# Patient Record
Sex: Female | Born: 1985 | Race: White | Hispanic: No | Marital: Single | Smoking: Never smoker
Health system: Southern US, Community
[De-identification: ages and names within clinical notes are randomized; demographics above are authoritative.]

## PROBLEM LIST (undated history)

## (undated) DIAGNOSIS — Z3490 Encounter for supervision of normal pregnancy, unspecified, unspecified trimester: Secondary | ICD-10-CM

## (undated) DIAGNOSIS — Q992 Fragile X chromosome: Secondary | ICD-10-CM

## (undated) HISTORY — PX: BREAST SURGERY: SHX581

## (undated) HISTORY — DX: Fragile x chromosome: Q99.2

## (undated) HISTORY — PX: APPENDECTOMY: SHX54

## (undated) HISTORY — PX: ECTOPIC PREGNANCY SURGERY: SHX613

## (undated) HISTORY — PX: UNILATERAL SALPINGECTOMY: SHX6160

## (undated) HISTORY — PX: CLAVICLE SURGERY: SHX598

---

## 2010-07-28 DIAGNOSIS — I499 Cardiac arrhythmia, unspecified: Secondary | ICD-10-CM

## 2016-10-05 ENCOUNTER — Encounter (HOSPITAL_COMMUNITY): Payer: Self-pay | Admitting: Emergency Medicine

## 2016-10-05 ENCOUNTER — Emergency Department (HOSPITAL_COMMUNITY)
Admission: EM | Admit: 2016-10-05 | Discharge: 2016-10-05 | Disposition: A | Discharge status: Home / Self Care | Payer: 59 | Attending: Emergency Medicine | Admitting: Emergency Medicine

## 2016-10-05 ENCOUNTER — Emergency Department (HOSPITAL_COMMUNITY): Payer: 59

## 2016-10-05 DIAGNOSIS — R109 Unspecified abdominal pain: Secondary | ICD-10-CM

## 2016-10-05 DIAGNOSIS — R82998 Other abnormal findings in urine: Secondary | ICD-10-CM

## 2016-10-05 DIAGNOSIS — R8299 Other abnormal findings in urine: Secondary | ICD-10-CM | POA: Diagnosis not present

## 2016-10-05 DIAGNOSIS — N760 Acute vaginitis: Secondary | ICD-10-CM

## 2016-10-05 DIAGNOSIS — B9689 Other specified bacterial agents as the cause of diseases classified elsewhere: Secondary | ICD-10-CM | POA: Diagnosis not present

## 2016-10-05 DIAGNOSIS — O2341 Unspecified infection of urinary tract in pregnancy, first trimester: Secondary | ICD-10-CM | POA: Insufficient documentation

## 2016-10-05 DIAGNOSIS — R102 Pelvic and perineal pain: Secondary | ICD-10-CM | POA: Insufficient documentation

## 2016-10-05 DIAGNOSIS — D72829 Elevated white blood cell count, unspecified: Secondary | ICD-10-CM | POA: Diagnosis not present

## 2016-10-05 DIAGNOSIS — Z3A08 8 weeks gestation of pregnancy: Secondary | ICD-10-CM | POA: Insufficient documentation

## 2016-10-05 DIAGNOSIS — O23591 Infection of other part of genital tract in pregnancy, first trimester: Secondary | ICD-10-CM | POA: Insufficient documentation

## 2016-10-05 DIAGNOSIS — O26891 Other specified pregnancy related conditions, first trimester: Secondary | ICD-10-CM | POA: Diagnosis not present

## 2016-10-05 DIAGNOSIS — O26899 Other specified pregnancy related conditions, unspecified trimester: Secondary | ICD-10-CM

## 2016-10-05 DIAGNOSIS — O99111 Other diseases of the blood and blood-forming organs and certain disorders involving the immune mechanism complicating pregnancy, first trimester: Secondary | ICD-10-CM | POA: Diagnosis not present

## 2016-10-05 LAB — I-STAT CG4 LACTIC ACID, ED
Lactic Acid, Venous: 1.16 mmol/L (ref 0.5–1.9)
Lactic Acid, Venous: 0.54 mmol/L (ref 0.5–1.9)

## 2016-10-05 LAB — COMPREHENSIVE METABOLIC PANEL
ALT: 12 U/L — ABNORMAL LOW (ref 14–54)
ANION GAP: 9 (ref 5–15)
AST: 17 U/L (ref 15–41)
Albumin: 3.9 g/dL (ref 3.5–5.0)
Alkaline Phosphatase: 39 U/L (ref 38–126)
BUN: 6 mg/dL (ref 6–20)
CHLORIDE: 103 mmol/L (ref 101–111)
CO2: 22 mmol/L (ref 22–32)
CREATININE: 0.78 mg/dL (ref 0.44–1.00)
Calcium: 9 mg/dL (ref 8.9–10.3)
Glucose, Bld: 107 mg/dL — ABNORMAL HIGH (ref 65–99)
POTASSIUM: 3.4 mmol/L — AB (ref 3.5–5.1)
Sodium: 134 mmol/L — ABNORMAL LOW (ref 135–145)
Total Bilirubin: 1.2 mg/dL (ref 0.3–1.2)
Total Protein: 6.9 g/dL (ref 6.5–8.1)

## 2016-10-05 LAB — CBC
HEMATOCRIT: 36.1 % (ref 36.0–46.0)
HEMOGLOBIN: 12.5 g/dL (ref 12.0–15.0)
MCH: 31.3 pg (ref 26.0–34.0)
MCHC: 34.6 g/dL (ref 30.0–36.0)
MCV: 90.3 fL (ref 78.0–100.0)
Platelets: 158 10*3/uL (ref 150–400)
RBC: 4 MIL/uL (ref 3.87–5.11)
RDW: 12.5 % (ref 11.5–15.5)
WBC: 11.8 10*3/uL — AB (ref 4.0–10.5)

## 2016-10-05 LAB — URINALYSIS, ROUTINE W REFLEX MICROSCOPIC
BACTERIA UA: NONE SEEN
Bilirubin Urine: NEGATIVE
GLUCOSE, UA: NEGATIVE mg/dL
KETONES UR: 80 mg/dL — AB
NITRITE: NEGATIVE
PROTEIN: 100 mg/dL — AB
Specific Gravity, Urine: 1.014 (ref 1.005–1.030)
pH: 5 (ref 5.0–8.0)

## 2016-10-05 LAB — WET PREP, GENITAL
SPERM: NONE SEEN
TRICH WET PREP: NONE SEEN
Yeast Wet Prep HPF POC: NONE SEEN

## 2016-10-05 LAB — I-STAT BETA HCG BLOOD, ED (MC, WL, AP ONLY): I-stat hCG, quantitative: >2000 m[IU]/mL — ABNORMAL HIGH (ref <5)

## 2016-10-05 LAB — HCG, QUANTITATIVE, PREGNANCY: HCG, BETA CHAIN, QUANT, S: 165917 m[IU]/mL — AB (ref <5)

## 2016-10-05 MED ORDER — METRONIDAZOLE 500 MG PO TABS: 500.0000 mg | ORAL_TABLET | Freq: Two times a day (BID) | ORAL | 0 refills | Status: DC

## 2016-10-05 MED ORDER — NITROFURANTOIN MONOHYD MACRO 100 MG PO CAPS: 100.0000 mg | ORAL_CAPSULE | Freq: Two times a day (BID) | ORAL | 0 refills | Status: DC

## 2016-10-05 MED ORDER — ONDANSETRON 4 MG PO TBDP
ORAL_TABLET | ORAL | Status: AC
  Filled 2016-10-05: qty 1

## 2016-10-05 MED ORDER — SODIUM CHLORIDE 0.9 % IV BOLUS (SEPSIS): 1000.0000 mL | Freq: Once | INTRAVENOUS | Status: DC

## 2016-10-05 MED ORDER — VITAMIN B-6 25 MG PO TABS: 25.0000 mg | ORAL_TABLET | Freq: Three times a day (TID) | ORAL | 0 refills | Status: DC | PRN

## 2016-10-05 MED ORDER — ONDANSETRON 4 MG PO TBDP
4.0000 mg | ORAL_TABLET | Freq: Once | ORAL | Status: AC | PRN
  Administered 2016-10-05: 4 mg via ORAL

## 2016-10-05 NOTE — ED Notes (Signed)
Up to b/r, steady gait, denies dizziness, pending urine sample.

## 2016-10-05 NOTE — ED Notes (Signed)
Pt taken to US
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
## 2016-10-05 NOTE — ED Notes (Signed)
Resident at bedside doing bedside [place]
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
## 2016-10-05 NOTE — ED Notes (Signed)
Alert, NAD, calm, interactive, resps e/u, speaking in clear complete sentences, no dyspnea noted, skin W&D, VSS, (denies: sob, dizziness).

## 2016-10-05 NOTE — ED Notes (Signed)
Back from US, no changes, "feel the same", alert, NAD, calm, VSS.

## 2016-10-05 NOTE — Discharge Instructions (Signed)
Maternal uterus/adnexae: 3.3 cm cyst in the left ovary. Trace free fluid in pelvis.  IMPRESSION: Eight week 4 day intrauterine pregnancy. Fetal heart rate 168 beats per minute. No acute maternal findings.
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
## 2016-10-06 LAB — GC/CHLAMYDIA PROBE AMP (~~LOC~~) NOT AT ARMC
CHLAMYDIA, DNA PROBE: NEGATIVE
NEISSERIA GONORRHEA: NEGATIVE

## 2016-10-07 LAB — BETA HCG QUANT (REF LAB): BETA HCG, TUMOR MARKER: 101566 m[IU]/mL

## 2016-10-08 LAB — URINE CULTURE

## 2016-10-09 ENCOUNTER — Telehealth: Payer: Self-pay | Admitting: Emergency Medicine

## 2016-10-09 NOTE — Telephone Encounter (Signed)
Post ED Visit - Positive Culture Follow-up  Culture report reviewed by antimicrobial stewardship pharmacist:  []  Enzo BiNathan Batchelder, Pharm.D. []  Celedonio MiyamotoJeremy Frens, Pharm.D., BCPS []  Garvin FilaMike Maccia, Pharm.D. []  Georgina PillionElizabeth Martin, Pharm.D., BCPS []  LansingMinh Pham, [address] Rainbow BoulevardPharm.D., BCPS, AAHIVP [x]  Estella HuskMichelle Turner, Pharm.D., BCPS, AAHIVP []  Tennis Mustassie Stewart, Pharm.D. []  Sherle Poeob Vincent, [place]arm[place]D.  Positive urine culture Treated with metronidazole and nitrofurantoin, organism sensitive to the same and no further patient follow-up is required at this time.  Berle MullMiller, Shontavia Mickel 10/09/2016, 4:51 PM

## 2016-10-23 ENCOUNTER — Telehealth: Payer: Self-pay

## 2016-10-23 NOTE — Telephone Encounter (Signed)
Called left message to set up new ob appointment.

## 2016-11-08 ENCOUNTER — Encounter: Payer: Self-pay | Admitting: Certified Nurse Midwife

## 2016-11-08 ENCOUNTER — Ambulatory Visit (INDEPENDENT_AMBULATORY_CARE_PROVIDER_SITE_OTHER): Payer: 59 | Admitting: Certified Nurse Midwife

## 2016-11-08 VITALS — BP 102/71 | HR 98 | Wt 126.4 lb

## 2016-11-08 DIAGNOSIS — O26892 Other specified pregnancy related conditions, second trimester: Secondary | ICD-10-CM

## 2016-11-08 DIAGNOSIS — O99891 Other specified diseases and conditions complicating pregnancy: Secondary | ICD-10-CM

## 2016-11-08 DIAGNOSIS — Z124 Encounter for screening for malignant neoplasm of cervix: Secondary | ICD-10-CM | POA: Diagnosis not present

## 2016-11-08 DIAGNOSIS — Z113 Encounter for screening for infections with a predominantly sexual mode of transmission: Secondary | ICD-10-CM | POA: Diagnosis not present

## 2016-11-08 DIAGNOSIS — Z8759 Personal history of other complications of pregnancy, childbirth and the puerperium: Secondary | ICD-10-CM | POA: Insufficient documentation

## 2016-11-08 DIAGNOSIS — Z98891 History of uterine scar from previous surgery: Secondary | ICD-10-CM

## 2016-11-08 DIAGNOSIS — Z148 Genetic carrier of other disease: Secondary | ICD-10-CM | POA: Insufficient documentation

## 2016-11-08 DIAGNOSIS — Z1151 Encounter for screening for human papillomavirus (HPV): Secondary | ICD-10-CM | POA: Diagnosis not present

## 2016-11-08 DIAGNOSIS — R8271 Bacteriuria: Secondary | ICD-10-CM | POA: Diagnosis not present

## 2016-11-08 DIAGNOSIS — R12 Heartburn: Secondary | ICD-10-CM | POA: Diagnosis not present

## 2016-11-08 DIAGNOSIS — O34219 Maternal care for unspecified type scar from previous cesarean delivery: Secondary | ICD-10-CM | POA: Diagnosis not present

## 2016-11-08 DIAGNOSIS — Q992 Fragile X chromosome: Secondary | ICD-10-CM | POA: Diagnosis not present

## 2016-11-08 DIAGNOSIS — Z6791 Unspecified blood type, Rh negative: Secondary | ICD-10-CM

## 2016-11-08 DIAGNOSIS — O9989 Other specified diseases and conditions complicating pregnancy, childbirth and the puerperium: Secondary | ICD-10-CM

## 2016-11-08 DIAGNOSIS — O26899 Other specified pregnancy related conditions, unspecified trimester: Secondary | ICD-10-CM

## 2016-11-08 DIAGNOSIS — Z3481 Encounter for supervision of other normal pregnancy, first trimester: Secondary | ICD-10-CM | POA: Diagnosis not present

## 2016-11-08 DIAGNOSIS — Z3480 Encounter for supervision of other normal pregnancy, unspecified trimester: Secondary | ICD-10-CM | POA: Insufficient documentation

## 2016-11-08 LAB — POCT URINALYSIS DIPSTICK
Bilirubin, UA: NEGATIVE
GLUCOSE UA: NEGATIVE
Ketones, UA: NEGATIVE
Leukocytes, UA: NEGATIVE
Nitrite, UA: NEGATIVE
Protein, UA: NEGATIVE
SPEC GRAV UA: 1.01 (ref 1.030–1.035)
Urobilinogen, UA: NEGATIVE (ref ?–2.0)
pH, UA: 6 (ref 5.0–8.0)

## 2016-11-08 MED ORDER — OMEPRAZOLE 20 MG PO CPDR: 20.0000 mg | DELAYED_RELEASE_CAPSULE | Freq: Two times a day (BID) | ORAL | 5 refills | Status: DC

## 2016-11-08 NOTE — Progress Notes (Signed)
Pt presents for NOB visit. Pt and her son has Fragile X gene and is requesting genetic testing.

## 2016-11-08 NOTE — Progress Notes (Signed)
Subjective:    Lisa Kim is being seen today for her first obstetrical visit.  This is not a planned pregnancy. She is at [redacted]w[redacted]d gestation. 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 Patient states her son has fragile x syndrome and is highly effected from this. Patient is interested in genetic screening for this reason.Relationship with FOB: significant other, not living together. Patient states fob will help her with the baby,not present for visit today.Patient does intend to breast feed. Pregnancy history fully reviewed.  The information documented in the HPI was reviewed and verified.  Menstrual History: OB History    Gravida Para Term Preterm AB Living   SAB TAB Ectopic Multiple Live Births   Obstetric Comments   SAB after fertility       Menarche age: 35  Patient's last menstrual period was 08/14/2016.    Past Medical History:  Diagnosis Date  . Fragile X syndrome     Past Surgical History:  Procedure Laterality Date  . APPENDECTOMY    . CESAREAN SECTION    . CLAVICLE SURGERY    . ECTOPIC PREGNANCY SURGERY       (Not in a hospital admission) Allergies  Allergen Reactions  . Penicillins Rash    CAUSES WATER BLISTERS Has patient had a PCN reaction causing immediate rash, facial/tongue/throat swelling, SOB or lightheadedness with hypotension: Yes Has patient had a PCN reaction causing severe rash involving mucus membranes or skin necrosis: No Has patient had a PCN reaction that required hospitalization No Has patient had a PCN reaction occurring within the last 10 years: No If all of the above answers are "NO", then may proceed with Cephalosporin use.   . Promethazine Hives, Itching and Rash    ALL-OVER RASH     Social History  Substance Use Topics  . Smoking status: Never Smoker  . Smokeless tobacco:  Never Used  . Alcohol use No    Family History  Problem Relation Age of Onset  . Diabetes type II Father   . Breast cancer Maternal Grandmother   . Throat cancer Paternal Grandmother   . Heart attack Paternal Grandfather      Review of Systems Constitutional: negative for weight loss Gastrointestinal: negative for vomiting Genitourinary:negative for genital lesions and vaginal discharge and dysuria Musculoskeletal:negative for back pain Behavioral/Psych: negative for abusive relationship, depression, illegal drug usage and tobacco use    Objective:    BP 102/71   Pulse 98   Wt 126 lb 6.4 oz (57.3 kg)   LMP 08/14/2016   BMI 22.39 kg/m  General Appearance:    Alert, cooperative, no distress, appears stated age  Head:    Normocephalic, without obvious abnormality, atraumatic  Eyes:    PERRL, conjunctiva/corneas clear, EOM's intact, fundi    benign, both eyes  Ears:    Normal TM's and external ear canals, both ears  Nose:   Nares normal, septum midline, mucosa normal, no drainage    or sinus tenderness  Throat:   Lips, mucosa, and tongue normal; teeth and gums normal  Neck:   Supple, symmetrical, trachea midline, no adenopathy;    thyroid:  no enlargement/tenderness/nodules; no carotid   bruit or JVD  Back:     Symmetric, no curvature, ROM normal, no CVA tenderness  Lungs:     Clear to auscultation bilaterally, respirations unlabored  Chest Wall:    No tenderness or deformity   Heart:    Regular rate and rhythm, S1 and S2 normal, no murmur, rub   or gallop  Breast Exam:    No tenderness, masses, or nipple abnormalit     Abdomen:     Soft, non-tender, bowel sounds active all four quadrants,    no masses, no organomegaly  Genitalia:    Normal female without lesion, discharge or tenderness, Cervix=Closed thick,long,posterior, FHT- 135 w/ doppler-size consistent w/ u/s dates  Extremities:   Extremities normal, atraumatic, no cyanosis or edema  Pulses:   2+ and symmetric all  extremities  Skin:   Skin color, texture, turgor normal, no rashes or lesions  Lymph nodes:   Cervical, supraclavicular, and axillary nodes normal  Neurologic:   CNII-XII intact, normal strength, sensation and reflexes    throughout      Lab Review Urine pregnancy test NO Labs reviewed yes Radiologic studies reviewed no studies Assessment:    Pregnancy at [redacted]w[redacted]d weeks ,FHT 135.   Plan:  Encounter for supervision of other normal pregnancy in first trimester -  Plan: Obstetric Panel, Including HIV, Culture, OB Urine, Cervicovaginal ancillary only, Cytology - PAP, TSH, Hemoglobin A1c, POCT urinalysis dipstick,  Fragile-X syndrome - Plan: CANCELED: Inheritest Society Guided, CANCELED: MaterniT Genome- Patient declined testing at this time.  Hx of ectopic pregnancy- 10/05/2016 8wk4 day intrauterine pregnancy,with hr@168   Heartburn during pregnancy in second trimester - Plan: omeprazole (PRILOSEC) 20 MG capsule  H/O: C-section-desires repeat C-section with tubal ligation       Prenatal vitamins.  Counseling provided regarding continued use of seat belts, cessation of alcohol consumption, smoking or use of illicit drugs; infection precautions i.e., influenza/TDAP immunizations, toxoplasmosis,CMV, parvovirus, listeria and varicella; workplace safety, exercise during pregnancy; routine dental care, safe medications, sexual activity, hot tubs, saunas, pools, travel, caffeine use, fish and methlymercury, potential toxins, hair treatments, varicose veins Weight gain recommendations per IOM guidelines reviewed: underweight/BMI< 18.5--> gain 28 - 40 lbs; normal weight/BMI 18.5 - 24.9--> gain 25 - 35 lbs; overweight/BMI 25 - 29.9--> gain 15 - 25 lbs; obese/BMI >30->gain  11 - 20 lbs Problem list reviewed and updated. FIRST/CF mutation testing/NIPT/QUAD SCREEN/fragile X/Ashkenazi Jewish population testing/Spinal muscular atrophy discussed: declined at this time. Role of ultrasound in pregnancy  discussed; fetal survey: ordered. Amniocentesis discussed: declined. Patient had amniocentesis with previous pregnancy Patient desires a repeat c/s with a tubal ligation because of concern of her dx for fragile x. Meds ordered this encounter  Medications  . omeprazole (PRILOSEC) 20 MG capsule    Sig: Take 1 capsule (20 mg total) by mouth 2 (two) times daily before a meal.    Dispense:  60 capsule    Refill:  5   Orders Placed This Encounter  Procedures  . Culture, OB Urine  . Obstetric Panel, Including HIV  . TSH  . Hemoglobin A1c  . POCT urinalysis dipstick    Follow up in 4 weeks ROB. 75% of * visit spent on counseling and coordination of care.

## 2016-11-09 ENCOUNTER — Encounter: Payer: Self-pay | Admitting: Certified Nurse Midwife

## 2016-11-09 LAB — TSH: TSH: 0.565 u[IU]/mL (ref 0.450–4.500)

## 2016-11-09 LAB — OBSTETRIC PANEL, INCLUDING HIV
Antibody Screen: NEGATIVE
BASOS ABS: 0 10*3/uL (ref 0.0–0.2)
Basos: 0 %
EOS (ABSOLUTE): 0.1 10*3/uL (ref 0.0–0.4)
EOS: 1 %
HEMATOCRIT: 35.9 % (ref 34.0–46.6)
HEMOGLOBIN: 12.3 g/dL (ref 11.1–15.9)
HEP B S AG: NEGATIVE
HIV Screen 4th Generation wRfx: NONREACTIVE
IMMATURE GRANULOCYTES: 0 %
Immature Grans (Abs): 0 10*3/uL (ref 0.0–0.1)
LYMPHS ABS: 2 10*3/uL (ref 0.7–3.1)
Lymphs: 21 %
MCH: 32.2 pg (ref 26.6–33.0)
MCHC: 34.3 g/dL (ref 31.5–35.7)
MCV: 94 fL (ref 79–97)
Monocytes Absolute: 0.6 10*3/uL (ref 0.1–0.9)
Monocytes: 7 %
NEUTROS PCT: 71 %
Neutrophils Absolute: 6.8 10*3/uL (ref 1.4–7.0)
Platelets: 176 10*3/uL (ref 150–379)
RBC: 3.82 x10E6/uL (ref 3.77–5.28)
RDW: 14.7 % (ref 12.3–15.4)
RH TYPE: NEGATIVE
RPR: NONREACTIVE
RUBELLA: 2.15 {index} (ref >0.99)
WBC: 9.5 10*3/uL (ref 3.4–10.8)

## 2016-11-09 LAB — CERVICOVAGINAL ANCILLARY ONLY
BACTERIAL VAGINITIS: NEGATIVE
Candida vaginitis: NEGATIVE
Chlamydia: NEGATIVE
Neisseria Gonorrhea: NEGATIVE
Trichomonas: NEGATIVE

## 2016-11-09 LAB — HEMOGLOBIN A1C
Est. average glucose Bld gHb Est-mCnc: 80 mg/dL
HEMOGLOBIN A1C: 4.4 % — AB (ref 4.8–5.6)

## 2016-11-10 LAB — CYTOLOGY - PAP
Adequacy: ABSENT
DIAGNOSIS: NEGATIVE
HPV (WINDOPATH): NOT DETECTED

## 2016-11-10 LAB — CULTURE, OB URINE

## 2016-11-10 LAB — URINE CULTURE, OB REFLEX

## 2016-11-19 ENCOUNTER — Encounter: Payer: Self-pay | Admitting: Family Medicine

## 2016-11-19 DIAGNOSIS — O9989 Other specified diseases and conditions complicating pregnancy, childbirth and the puerperium: Secondary | ICD-10-CM

## 2016-11-19 DIAGNOSIS — O99891 Other specified diseases and conditions complicating pregnancy: Secondary | ICD-10-CM | POA: Insufficient documentation

## 2016-11-19 DIAGNOSIS — Z6791 Unspecified blood type, Rh negative: Secondary | ICD-10-CM | POA: Insufficient documentation

## 2016-11-19 DIAGNOSIS — R8271 Bacteriuria: Secondary | ICD-10-CM | POA: Insufficient documentation

## 2016-11-19 DIAGNOSIS — O26899 Other specified pregnancy related conditions, unspecified trimester: Secondary | ICD-10-CM

## 2016-11-22 DIAGNOSIS — Z3482 Encounter for supervision of other normal pregnancy, second trimester: Secondary | ICD-10-CM | POA: Diagnosis not present

## 2016-11-22 DIAGNOSIS — Z3689 Encounter for other specified antenatal screening: Secondary | ICD-10-CM | POA: Diagnosis not present

## 2016-12-06 ENCOUNTER — Encounter: Payer: 59 | Admitting: Certified Nurse Midwife

## 2016-12-25 DIAGNOSIS — Z3686 Encounter for antenatal screening for cervical length: Secondary | ICD-10-CM | POA: Diagnosis not present

## 2016-12-25 DIAGNOSIS — Z361 Encounter for antenatal screening for raised alphafetoprotein level: Secondary | ICD-10-CM | POA: Diagnosis not present

## 2016-12-25 DIAGNOSIS — Z363 Encounter for antenatal screening for malformations: Secondary | ICD-10-CM | POA: Diagnosis not present

## 2017-01-14 ENCOUNTER — Emergency Department (HOSPITAL_COMMUNITY): Payer: 59

## 2017-01-14 ENCOUNTER — Encounter (HOSPITAL_COMMUNITY): Payer: Self-pay

## 2017-01-14 ENCOUNTER — Emergency Department (HOSPITAL_COMMUNITY)
Admission: EM | Admit: 2017-01-14 | Discharge: 2017-01-14 | Disposition: A | Discharge status: Home / Self Care | Payer: 59 | Attending: Emergency Medicine | Admitting: Emergency Medicine

## 2017-01-14 DIAGNOSIS — O26899 Other specified pregnancy related conditions, unspecified trimester: Secondary | ICD-10-CM | POA: Diagnosis not present

## 2017-01-14 DIAGNOSIS — R109 Unspecified abdominal pain: Secondary | ICD-10-CM | POA: Insufficient documentation

## 2017-01-14 DIAGNOSIS — Z79899 Other long term (current) drug therapy: Secondary | ICD-10-CM | POA: Insufficient documentation

## 2017-01-14 DIAGNOSIS — Z3A22 22 weeks gestation of pregnancy: Secondary | ICD-10-CM | POA: Diagnosis not present

## 2017-01-14 DIAGNOSIS — Z3A23 23 weeks gestation of pregnancy: Secondary | ICD-10-CM | POA: Diagnosis not present

## 2017-01-14 DIAGNOSIS — O26892 Other specified pregnancy related conditions, second trimester: Secondary | ICD-10-CM | POA: Diagnosis not present

## 2017-01-14 HISTORY — DX: Encounter for supervision of normal pregnancy, unspecified, unspecified trimester: Z34.90

## 2017-01-14 LAB — CBC WITH DIFFERENTIAL/PLATELET
BASOS ABS: 0 10*3/uL (ref 0.0–0.1)
BASOS PCT: 0 %
Eosinophils Absolute: 0.1 10*3/uL (ref 0.0–0.7)
Eosinophils Relative: 1 %
HEMATOCRIT: 31.5 % — AB (ref 36.0–46.0)
Hemoglobin: 11 g/dL — ABNORMAL LOW (ref 12.0–15.0)
LYMPHS PCT: 19 %
Lymphs Abs: 1.9 10*3/uL (ref 0.7–4.0)
MCH: 32.9 pg (ref 26.0–34.0)
MCHC: 34.9 g/dL (ref 30.0–36.0)
MCV: 94.3 fL (ref 78.0–100.0)
Monocytes Absolute: 0.9 10*3/uL (ref 0.1–1.0)
Monocytes Relative: 9 %
NEUTROS ABS: 6.8 10*3/uL (ref 1.7–7.7)
Neutrophils Relative %: 71 %
PLATELETS: 139 10*3/uL — AB (ref 150–400)
RBC: 3.34 MIL/uL — AB (ref 3.87–5.11)
RDW: 14.1 % (ref 11.5–15.5)
WBC: 9.6 10*3/uL (ref 4.0–10.5)

## 2017-01-14 LAB — COMPREHENSIVE METABOLIC PANEL
ALBUMIN: 2.8 g/dL — AB (ref 3.5–5.0)
ALT: 13 U/L — AB (ref 14–54)
AST: 20 U/L (ref 15–41)
Alkaline Phosphatase: 40 U/L (ref 38–126)
Anion gap: 7 (ref 5–15)
BILIRUBIN TOTAL: 0.6 mg/dL (ref 0.3–1.2)
BUN: 9 mg/dL (ref 6–20)
CHLORIDE: 105 mmol/L (ref 101–111)
CO2: 22 mmol/L (ref 22–32)
CREATININE: 0.59 mg/dL (ref 0.44–1.00)
Calcium: 8.6 mg/dL — ABNORMAL LOW (ref 8.9–10.3)
GFR calc Af Amer: >60 mL/min (ref >60)
GFR calc non Af Amer: >60 mL/min (ref >60)
GLUCOSE: 88 mg/dL (ref 65–99)
POTASSIUM: 3.4 mmol/L — AB (ref 3.5–5.1)
Sodium: 134 mmol/L — ABNORMAL LOW (ref 135–145)
Total Protein: 5.7 g/dL — ABNORMAL LOW (ref 6.5–8.1)

## 2017-01-14 LAB — URINALYSIS, ROUTINE W REFLEX MICROSCOPIC
Bilirubin Urine: NEGATIVE
Glucose, UA: NEGATIVE mg/dL
Hgb urine dipstick: NEGATIVE
KETONES UR: NEGATIVE mg/dL
LEUKOCYTES UA: NEGATIVE
NITRITE: NEGATIVE
Protein, ur: NEGATIVE mg/dL
Specific Gravity, Urine: 1.004 — ABNORMAL LOW (ref 1.005–1.030)
pH: 6 (ref 5.0–8.0)

## 2017-01-14 MED ORDER — SODIUM CHLORIDE 0.9 % IV BOLUS (SEPSIS)
2000.0000 mL | Freq: Once | INTRAVENOUS | Status: AC
  Administered 2017-01-14: 2000 mL via INTRAVENOUS

## 2017-01-14 NOTE — ED Triage Notes (Signed)
Patient complains of abdominal cramping x 2 days. States that she is [redacted] weeks pregnant and being treated for recurrent bladder infections and unsure if bladder pain or uterine discomfort. No spotting, no bleeding. G2, P1. Currently taking antibiotics as a preventative measure prescribed by OB. Normal fetal movement per patient

## 2017-01-14 NOTE — Progress Notes (Signed)
EFM reapplied after U/S

## 2017-01-14 NOTE — Progress Notes (Signed)
Dr Juliene PinaMody notified of patient's arrival and complaints and orders given for OB ultrasound for cervical length and 20-30 minutes of monitoring for contractions at this time

## 2017-01-14 NOTE — Discharge Instructions (Signed)
Push fluids to stay hydrated. Follow-up with your GYN. Return here with new or worsening symptoms.

## 2017-01-14 NOTE — Progress Notes (Signed)
[facility] with complaints of lower abdominal pain and back pain that started Friday and she describes them as bad period cramps that have been off and on; patient denies bleeding and leaking of fluid; patient states she has not had any problems with this pregnancy up to this point but she is currently on antibiotics for recurrent kidney infections at this time; patient is a G6P1 who is [redacted] weeks along in her pregnancy at this time; Doppler heart rate resulted in 150s at this time

## 2017-01-14 NOTE — ED Provider Notes (Signed)
[facility]-EMERGENCY DEPT Provider Note   CSN: [id number] Arrival date & time: 01/14/17  [id number]     History   Chief Complaint Chief Complaint  Patient presents with  . pregnant/ abd. pain    HPI Lisa Kim is a 31 y.o. female.Chief complaint is lower abdominal cramping and pregnant  HPI: 31 year old female. [redacted] weeks pregnant by dates. G3 P2. Over the last 72 hours had intermittent lower abdominal cramping. No spotting or bleeding. On day 1 [place] [facility] [place]. She admits that she got somewhat hot but was not in prolonged heat or dehydrated that day. Saturday, yesterday, and today she was inside or driving but feel she was hydrating well she's had recurrent episodes of lower abdominal cramping since that time. He is on daily prophylactic antibodies due to multiple UTIs during her pregnancy. Does not have UTI symptoms.  Past Medical History:  Diagnosis Date  . Fragile X syndrome   . Pregnant     Patient Active Problem List   Diagnosis Date Noted  . Rh negative state in antepartum period 11/19/2016  . Asymptomatic bacteriuria during pregnancy 11/19/2016  . Supervision of other normal pregnancy, antepartum 11/08/2016  . 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 Hx of ectopic pregnancy 11/08/2016  . Previous cesarean section complicating pregnancy, antepartum condition or complication 11/08/2016    Past Surgical History:  Procedure Laterality Date  . APPENDECTOMY    . CESAREAN SECTION    . CLAVICLE SURGERY    . ECTOPIC PREGNANCY SURGERY      OB History    Gravida Para Term Preterm AB Living   6       4 1    SAB TAB Ectopic Multiple Live Births   2 1 1   1       Obstetric Comments   SAB after fertility        Home Medications    Prior to Admission medications   Medication Sig Start Date End Date Taking? Authorizing Provider  nitrofurantoin (MACRODANTIN) 100 MG capsule Take 100 mg by mouth daily.   Yes [provider]  Prenatal Vit-Fe  Fumarate-FA (PRENATAL MULTIVITAMIN) TABS tablet Take 1 tablet by mouth daily.    Yes [provider]  vitamin B-6 (PYRIDOXINE) 25 MG tablet Take 1 tablet (25 mg total) by mouth every 8 (eight) hours as needed (nausea). 10/05/16  Yes Ruch, Gordy CouncilmanAlison Charruf, MD    Family History Family History  Problem Relation Age of Onset  . Diabetes type II Father   . Breast cancer Maternal Grandmother   . Throat cancer Paternal Grandmother   . Heart attack Paternal Grandfather     Social History Social History  Substance Use Topics  . Smoking status: Never Smoker  . Smokeless tobacco: Never Used  . Alcohol use No     Allergies   Penicillins and Promethazine   Review of Systems Review of Systems  Constitutional: Negative for appetite change, chills, diaphoresis, fatigue and fever.  HENT: Negative for mouth sores, sore throat and trouble swallowing.   Eyes: Negative for visual disturbance.  Respiratory: Negative for cough, chest tightness, shortness of breath and wheezing.   Cardiovascular: Negative for chest pain.  Gastrointestinal: Positive for abdominal pain. Negative for abdominal distention, diarrhea, nausea and vomiting.  Endocrine: Negative for polydipsia, polyphagia and polyuria.  Genitourinary: Negative for dysuria, frequency and hematuria.  Musculoskeletal: Negative for gait problem.  Skin: Negative for color change, pallor and rash.  Neurological: Negative for dizziness, syncope,  light-headedness and headaches.  Hematological: Does not bruise/bleed easily.  Psychiatric/Behavioral: Negative for behavioral problems and confusion.     Physical Exam Updated Vital Signs BP 90/63   Pulse 86   Temp 98.3 F (36.8 C) (Oral)   Resp 16   LMP 08/14/2016   SpO2 100%   Physical Exam  Constitutional: She is oriented to person, place, and time. She appears well-developed and well-nourished. No distress.  HENT:  Head: Normocephalic.  Eyes: Conjunctivae are normal. Pupils are  equal, round, and reactive to light. No scleral icterus.  Neck: Normal range of motion. Neck supple. No thyromegaly present.  Cardiovascular: Normal rate and regular rhythm.  Exam reveals no gallop and no friction rub.   No murmur heard. Pulmonary/Chest: Effort normal and breath sounds normal. No respiratory distress. She has no wheezes. She has no rales.  Abdominal: Soft. Bowel sounds are normal. She exhibits no distension. There is no tenderness. There is no rebound.  Gravid abdomen. Nontender to palpate.  Musculoskeletal: Normal range of motion.  Neurological: She is alert and oriented to person, place, and time.  Skin: Skin is warm and dry. No rash noted.  Psychiatric: She has a normal mood and affect. Her behavior is normal.     ED Treatments / Results  Labs (all labs ordered are listed, but only abnormal results are displayed) Labs Reviewed  URINALYSIS, ROUTINE W REFLEX MICROSCOPIC - Abnormal; Notable for the following:       Result Value   Color, Urine STRAW (*)    Specific Gravity, Urine 1.004 (*)    All other components within normal limits  CBC WITH DIFFERENTIAL/PLATELET - Abnormal; Notable for the following:    RBC 3.34 (*)    Hemoglobin 11.0 (*)    HCT 31.5 (*)    Platelets 139 (*)    All other components within normal limits  COMPREHENSIVE METABOLIC PANEL - Abnormal; Notable for the following:    Sodium 134 (*)    Potassium 3.4 (*)    Calcium 8.6 (*)    Total Protein 5.7 (*)    Albumin 2.8 (*)    ALT 13 (*)    All other components within normal limits    EKG  EKG Interpretation None       Radiology US Ob Limited  Result Date: 01/14/2017 CLINICAL DATA:  Abdominal pain EXAM: LIMITED OBSTETRIC ULTRASOUND FINDINGS: Number of Fetuses: 1 Heart Rate:  132 bpm Movement: Visualized Presentation: Cephalic Placental Location: Posterior Previa: Not visualized Amniotic Fluid (Subjective):  Within normal limits. BPD:  5.63cm 23w  1d MATERNAL FINDINGS: Cervix:  Appears  closed. Uterus/Adnexae: History of right oophorectomy. Left ovary is nonvisualized. IMPRESSION: Single viable intrauterine pregnancy as above. No specific abnormalities are visualized. This exam is performed on an emergent basis and does not comprehensively evaluate fetal size, dating, or anatomy; follow-up complete OB US should be considered if further fetal assessment is warranted. Electronically Signed   By: Jasmine Pang M.D.   On: 01/14/2017 21:52    Procedures Procedures (including critical care time)  Medications Ordered in ED Medications  sodium chloride 0.9 % bolus 2,000 mL (0 mLs Intravenous Stopped 01/14/17 2208)     Initial Impression / Assessment and Plan / ED Course  I have reviewed the triage vital signs and the nursing notes.  Pertinent labs & imaging results that were available during my care of the patient were reviewed by me and considered in my medical decision making (see chart for details).  Symptoms and history is not suggestive of separate other acute condition. No bleeding or spotting. We'll obtain ultrasound to rule out abruption. Does not have clonus or hypertension. Plan labs, fluid hydration, will have rather be placed monitors rule out labor. Plan reassessment.  Final Clinical Impressions(s) / ED Diagnoses   Final diagnoses:  Abdominal pain affecting pregnancy    Ultrasound reassuring. Strips reassuring with good fetal activity and reactivity. Occasional uterine irritability was no organized labor. Discussed with OB attending tonight by [facility] triage nurse. Patient appropriate for discharge home.  New Prescriptions New Prescriptions   No medications on file     Rolland Porter, MD 01/14/17 2253

## 2017-01-14 NOTE — Progress Notes (Signed)
Dr Juliene PinaMody notified of ultrasound results and EFM tracings and lab results; orders given to discharge patient and have patient follow up with OB provider on monday

## 2017-01-14 NOTE — Progress Notes (Signed)
EFM removed for patient to go to ultrasound at this time

## 2017-01-15 ENCOUNTER — Other Ambulatory Visit: Payer: Self-pay | Admitting: Obstetrics

## 2017-01-17 DIAGNOSIS — O2302 Infections of kidney in pregnancy, second trimester: Secondary | ICD-10-CM | POA: Diagnosis not present

## 2017-01-17 DIAGNOSIS — Z3A23 23 weeks gestation of pregnancy: Secondary | ICD-10-CM | POA: Diagnosis not present

## 2017-01-17 DIAGNOSIS — Z362 Encounter for other antenatal screening follow-up: Secondary | ICD-10-CM | POA: Diagnosis not present

## 2017-02-19 DIAGNOSIS — O2302 Infections of kidney in pregnancy, second trimester: Secondary | ICD-10-CM | POA: Diagnosis not present

## 2017-02-19 DIAGNOSIS — O360120 Maternal care for anti-D [Rh] antibodies, second trimester, not applicable or unspecified: Secondary | ICD-10-CM | POA: Diagnosis not present

## 2017-02-19 DIAGNOSIS — Z3A28 28 weeks gestation of pregnancy: Secondary | ICD-10-CM | POA: Diagnosis not present

## 2017-02-19 DIAGNOSIS — Z23 Encounter for immunization: Secondary | ICD-10-CM | POA: Diagnosis not present

## 2017-02-19 DIAGNOSIS — Z3689 Encounter for other specified antenatal screening: Secondary | ICD-10-CM | POA: Diagnosis not present

## 2017-03-05 DIAGNOSIS — N9411 Superficial (introital) dyspareunia: Secondary | ICD-10-CM | POA: Diagnosis not present

## 2017-03-07 ENCOUNTER — Other Ambulatory Visit: Payer: Self-pay | Admitting: Certified Nurse Midwife

## 2017-04-12 IMAGING — US US OB TRANSVAGINAL
1 series · 14 of 28 positions shown · non-contrast
Comparison: None.

CLINICAL DATA: Abdominal pain

EXAM:
OBSTETRIC <14 WK US AND TRANSVAGINAL OB US
TECHNIQUE: Both transabdominal and transvaginal ultrasound examinations were
performed for complete evaluation of the gestation as well as the
maternal uterus, adnexal regions, and pelvic cul-de-sac.
Transvaginal technique was performed to assess early pregnancy.

[Series 1: us ob transvaginal · 0.26mm/px · 14 of 64 slices shown]
[im 3/64]
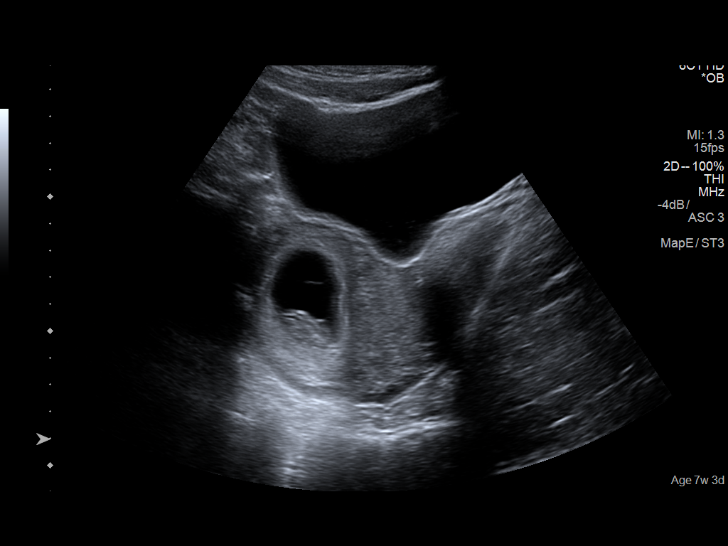
[im 8/64]
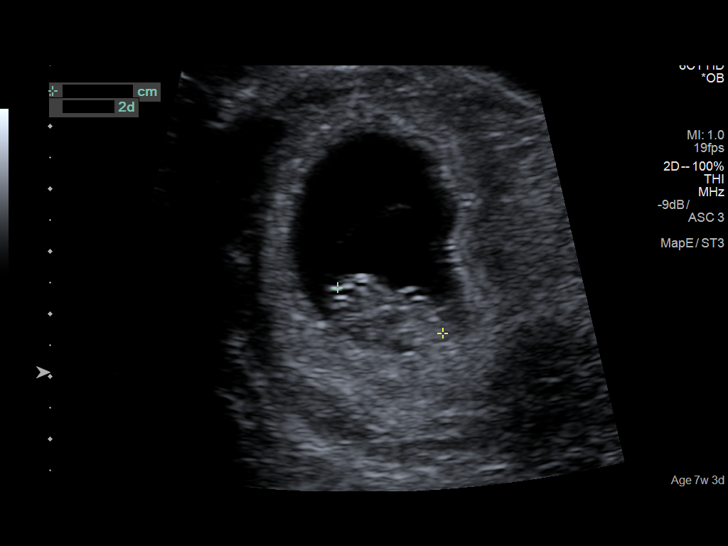
[im 12/64]
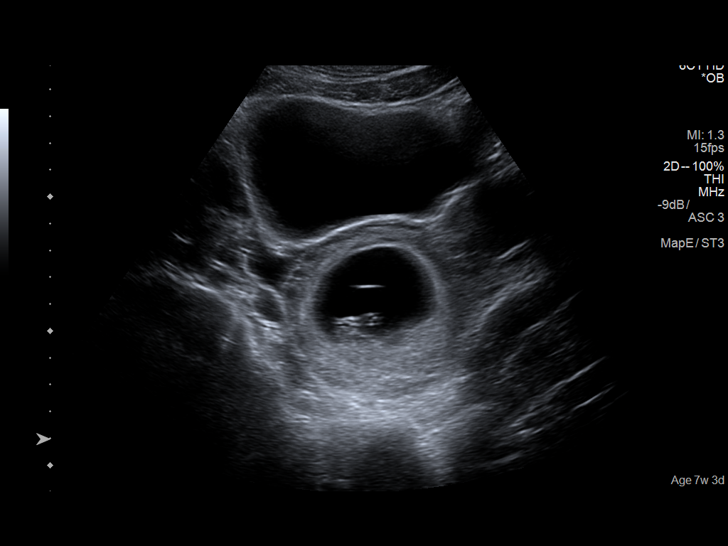
[im 17/64]
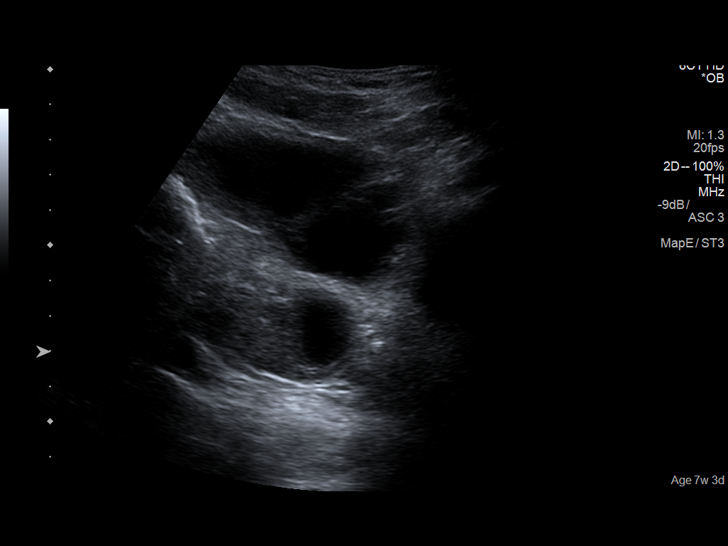
[im 22/64]
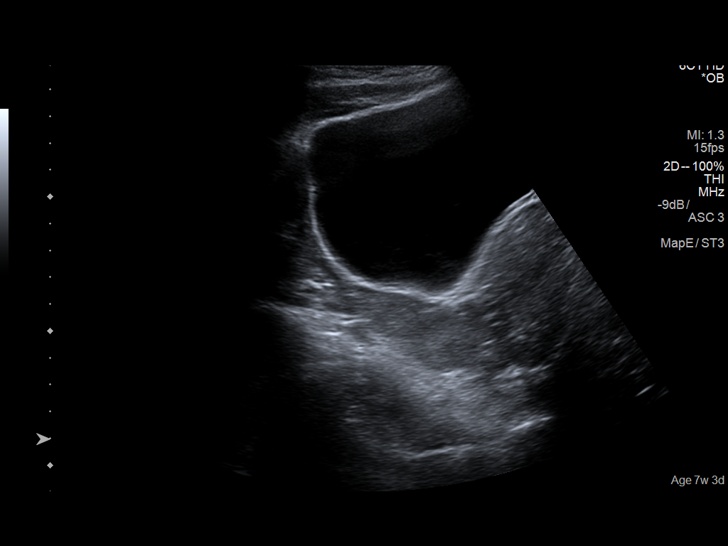
[im 26/64]
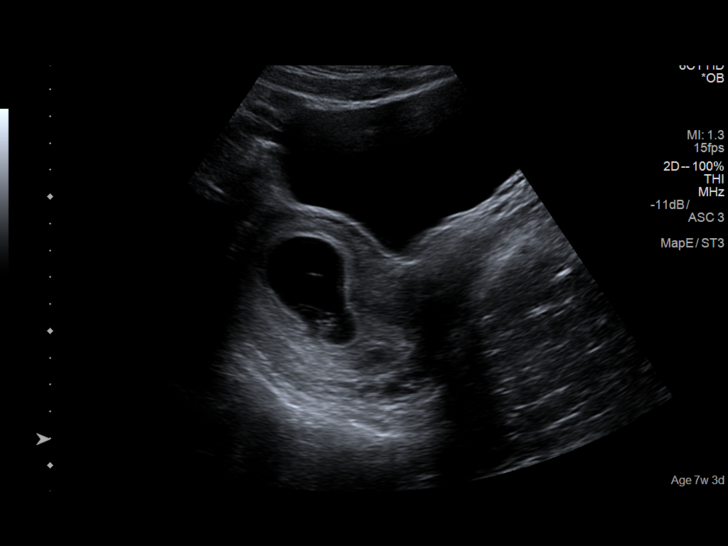
[im 31/64]
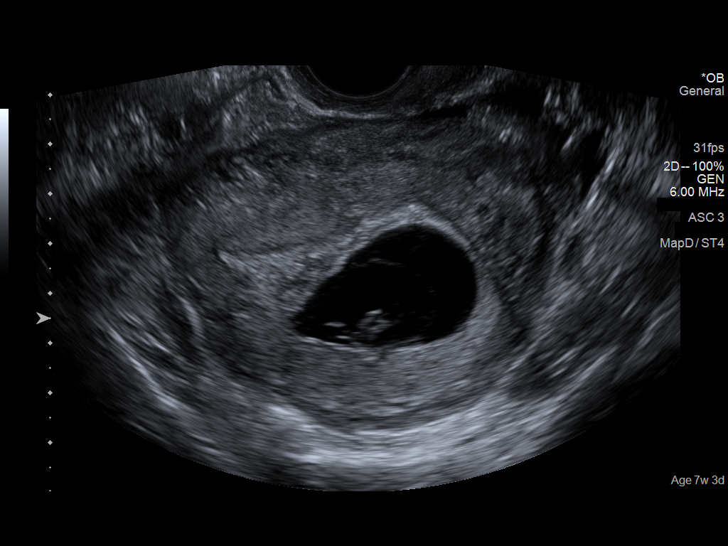
[im 36/64]
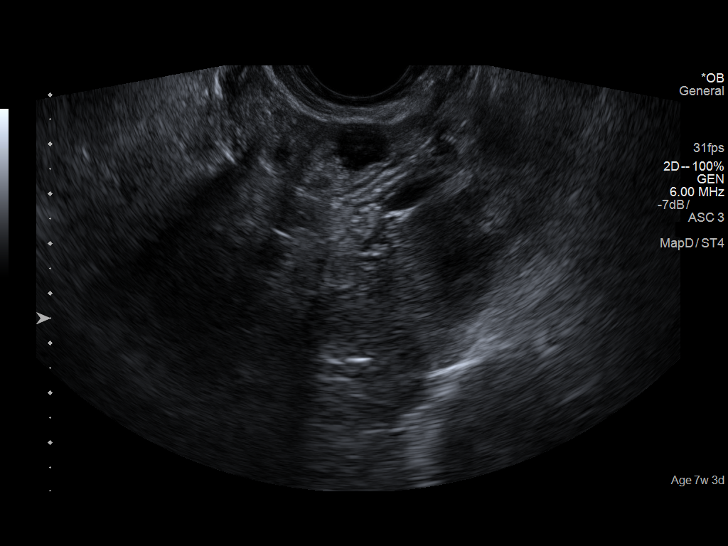
[im 40/64]
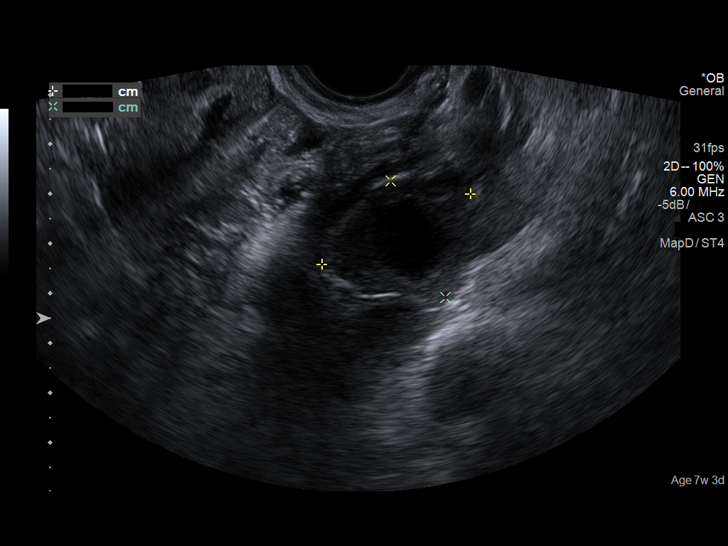
[im 45/64]
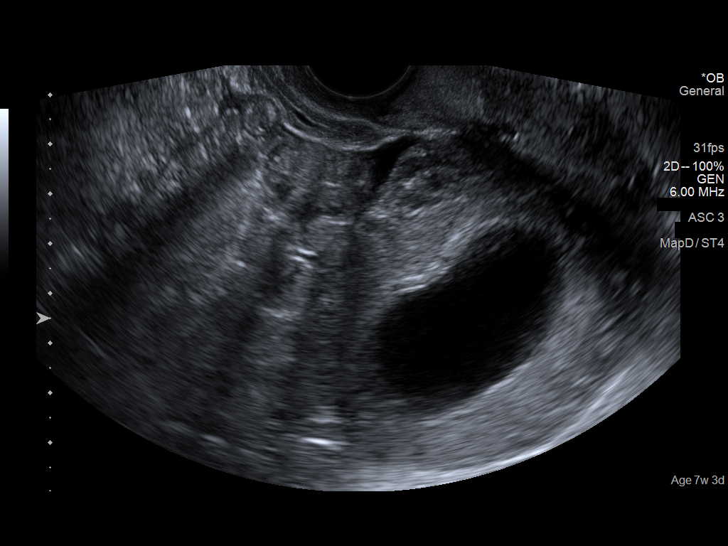
[im 50/64]
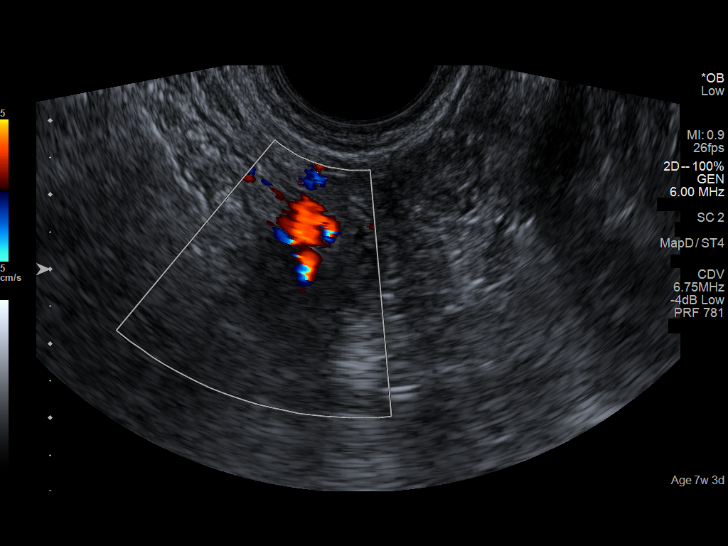
[im 54/64]
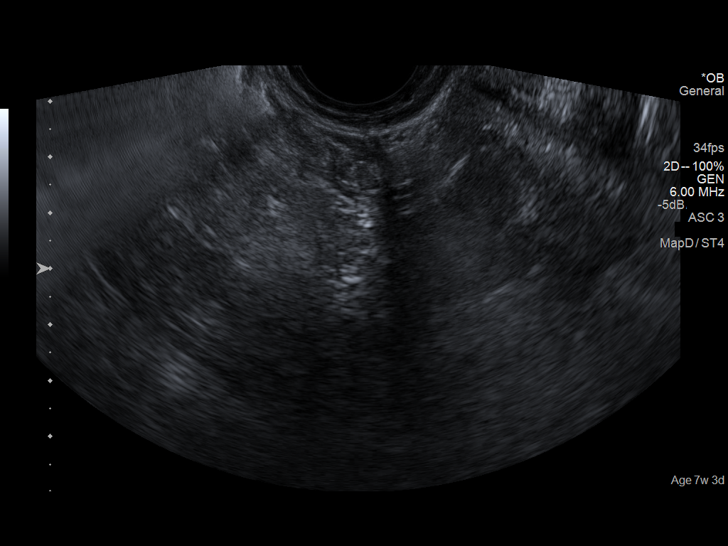
[im 59/64]
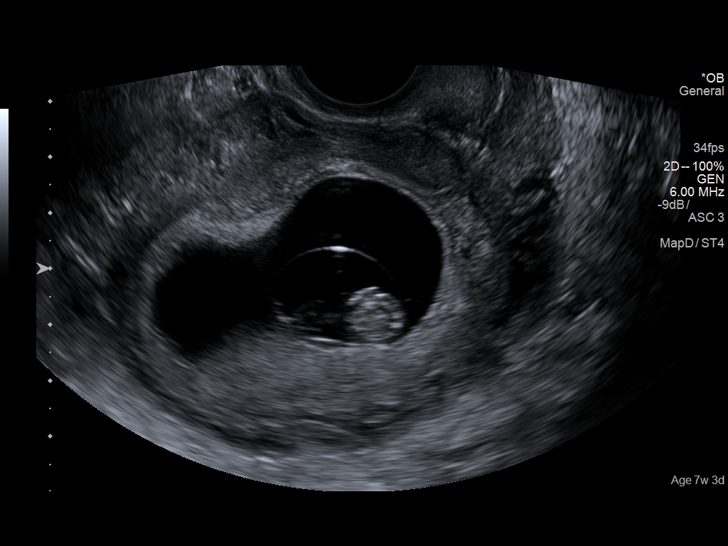
[im 64/64]
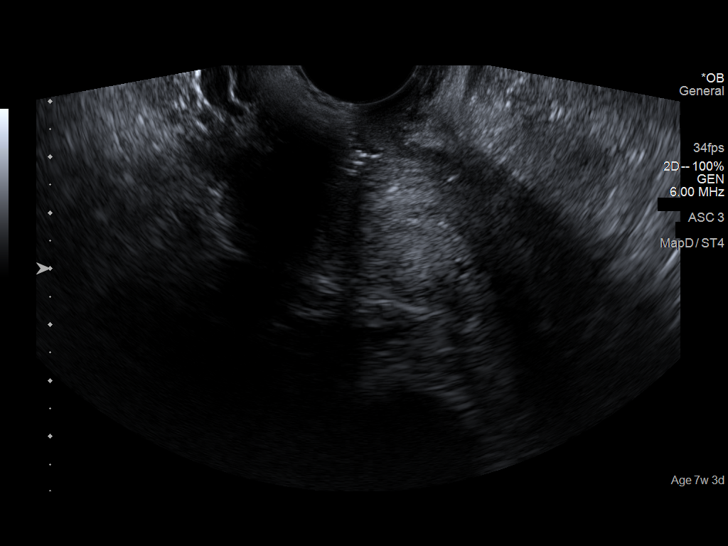

[14 of 28 positions shown; findings below may reference images not displayed]

FINDINGS: Intrauterine gestational sac: Single

Yolk sac:  Visualized

Embryo:  Visualized

Cardiac Activity: Visualized

Heart Rate: 168  bpm

MSD:   mm    w     d

CRL:  19.5  mm   8 w   4 d                  US EDC: 05/13/2017

Subchorionic hemorrhage:  None visualized.

Maternal uterus/adnexae: 3.3 cm cyst in the left ovary. Trace free
fluid in pelvis.
IMPRESSION: Eight week 4 day intrauterine pregnancy. Fetal heart rate 168 beats
per minute. No acute maternal findings.

## 2017-04-16 DIAGNOSIS — Z3685 Encounter for antenatal screening for Streptococcus B: Secondary | ICD-10-CM | POA: Diagnosis not present

## 2017-04-16 LAB — OB RESULTS CONSOLE GBS: STREP GROUP B AG: NEGATIVE

## 2017-04-17 ENCOUNTER — Other Ambulatory Visit: Payer: Self-pay | Admitting: Obstetrics

## 2017-04-30 ENCOUNTER — Encounter (HOSPITAL_COMMUNITY): Payer: Self-pay

## 2017-04-30 ENCOUNTER — Telehealth (HOSPITAL_COMMUNITY): Payer: Self-pay | Admitting: *Deleted

## 2017-04-30 NOTE — Telephone Encounter (Signed)
Preadmission screen  

## 2017-05-01 ENCOUNTER — Encounter (HOSPITAL_COMMUNITY): Payer: Self-pay

## 2017-05-07 ENCOUNTER — Encounter (HOSPITAL_COMMUNITY): Payer: Self-pay

## 2017-05-07 ENCOUNTER — Encounter (HOSPITAL_COMMUNITY)
Admission: RE | Admit: 2017-05-07 | Discharge: 2017-05-07 | Disposition: A | Discharge status: Home / Self Care | Payer: 59 | Source: Ambulatory Visit | Attending: Obstetrics | Admitting: Obstetrics

## 2017-05-07 DIAGNOSIS — Z302 Encounter for sterilization: Secondary | ICD-10-CM | POA: Diagnosis not present

## 2017-05-07 DIAGNOSIS — O34211 Maternal care for low transverse scar from previous cesarean delivery: Secondary | ICD-10-CM | POA: Diagnosis not present

## 2017-05-07 DIAGNOSIS — O9081 Anemia of the puerperium: Secondary | ICD-10-CM | POA: Diagnosis not present

## 2017-05-07 DIAGNOSIS — N12 Tubulo-interstitial nephritis, not specified as acute or chronic: Secondary | ICD-10-CM | POA: Diagnosis not present

## 2017-05-07 DIAGNOSIS — O9912 Other diseases of the blood and blood-forming organs and certain disorders involving the immune mechanism complicating childbirth: Secondary | ICD-10-CM | POA: Diagnosis not present

## 2017-05-07 DIAGNOSIS — Z23 Encounter for immunization: Secondary | ICD-10-CM | POA: Diagnosis not present

## 2017-05-07 DIAGNOSIS — O26893 Other specified pregnancy related conditions, third trimester: Secondary | ICD-10-CM | POA: Diagnosis not present

## 2017-05-07 DIAGNOSIS — D62 Acute posthemorrhagic anemia: Secondary | ICD-10-CM | POA: Diagnosis not present

## 2017-05-07 LAB — CBC
HCT: 30.4 % — ABNORMAL LOW (ref 36.0–46.0)
Hemoglobin: 10.3 g/dL — ABNORMAL LOW (ref 12.0–15.0)
MCH: 31.5 pg (ref 26.0–34.0)
MCHC: 33.9 g/dL (ref 30.0–36.0)
MCV: 93 fL (ref 78.0–100.0)
PLATELETS: 98 10*3/uL — AB (ref 150–400)
RBC: 3.27 MIL/uL — ABNORMAL LOW (ref 3.87–5.11)
RDW: 13.8 % (ref 11.5–15.5)
WBC: 7 10*3/uL (ref 4.0–10.5)

## 2017-05-07 NOTE — Patient Instructions (Signed)
20 Lisa Kim  05/07/2017   Your procedure is scheduled on:  10.2.2018  Enter through the Main Entrance of [facility] at 1100 AM.  Pick up the phone at the desk and dial [phone].   Call this number if you have problems the morning of surgery: [phone]   Remember:   Do not eat food:After Midnight.  Do not drink clear liquids: After Midnight.  Take these medicines the morning of surgery with A SIP OF WATER: take your macrodantin   Do not wear jewelry, make-up or nail polish.  Do not wear lotions, powders, or perfumes. Do not wear deodorant.  Do not shave 48 hours prior to surgery.  Do not bring valuables to the hospital.  [facility] is not   responsible for any belongings or valuables brought to the hospital.  Contacts, dentures or bridgework may not be worn into surgery.  Leave suitcase in the car. After surgery it may be brought to your room.  For patients admitted to the hospital, checkout time is 11:00 AM the day of              discharge.   Patients discharged the day of surgery will not be allowed to drive             home.  Name and phone number of your driver: na  Special Instructions:   N/A   Please read over the following fact sheets that you were given:   Surgical Site Infection Prevention

## 2017-05-08 ENCOUNTER — Encounter (HOSPITAL_COMMUNITY): Payer: Self-pay | Admitting: Anesthesiology

## 2017-05-08 ENCOUNTER — Encounter (HOSPITAL_COMMUNITY)
Admission: RE | Disposition: A | Discharge status: Home / Self Care | Payer: Self-pay | Source: Ambulatory Visit | Attending: Obstetrics

## 2017-05-08 ENCOUNTER — Inpatient Hospital Stay (HOSPITAL_COMMUNITY)
Admission: RE | Admit: 2017-05-08 | Discharge: 2017-05-11 | DRG: 783 | Disposition: A | Discharge status: Home / Self Care | Payer: 59 | Source: Ambulatory Visit | Attending: Obstetrics | Admitting: Obstetrics

## 2017-05-08 ENCOUNTER — Inpatient Hospital Stay (HOSPITAL_COMMUNITY): Payer: 59 | Admitting: Anesthesiology

## 2017-05-08 DIAGNOSIS — O26899 Other specified pregnancy related conditions, unspecified trimester: Secondary | ICD-10-CM

## 2017-05-08 DIAGNOSIS — O9912 Other diseases of the blood and blood-forming organs and certain disorders involving the immune mechanism complicating childbirth: Secondary | ICD-10-CM | POA: Diagnosis present

## 2017-05-08 DIAGNOSIS — O34211 Maternal care for low transverse scar from previous cesarean delivery: Secondary | ICD-10-CM | POA: Diagnosis not present

## 2017-05-08 DIAGNOSIS — N12 Tubulo-interstitial nephritis, not specified as acute or chronic: Secondary | ICD-10-CM | POA: Diagnosis present

## 2017-05-08 DIAGNOSIS — D696 Thrombocytopenia, unspecified: Secondary | ICD-10-CM | POA: Diagnosis present

## 2017-05-08 DIAGNOSIS — D6959 Other secondary thrombocytopenia: Secondary | ICD-10-CM | POA: Diagnosis present

## 2017-05-08 DIAGNOSIS — Z23 Encounter for immunization: Secondary | ICD-10-CM

## 2017-05-08 DIAGNOSIS — D62 Acute posthemorrhagic anemia: Secondary | ICD-10-CM | POA: Diagnosis present

## 2017-05-08 DIAGNOSIS — Z880 Allergy status to penicillin: Secondary | ICD-10-CM | POA: Diagnosis not present

## 2017-05-08 DIAGNOSIS — Z3A39 39 weeks gestation of pregnancy: Secondary | ICD-10-CM

## 2017-05-08 DIAGNOSIS — O26893 Other specified pregnancy related conditions, third trimester: Secondary | ICD-10-CM | POA: Diagnosis present

## 2017-05-08 DIAGNOSIS — Z6791 Unspecified blood type, Rh negative: Secondary | ICD-10-CM | POA: Diagnosis not present

## 2017-05-08 DIAGNOSIS — Z98891 History of uterine scar from previous surgery: Secondary | ICD-10-CM

## 2017-05-08 DIAGNOSIS — O34219 Maternal care for unspecified type scar from previous cesarean delivery: Secondary | ICD-10-CM | POA: Diagnosis not present

## 2017-05-08 DIAGNOSIS — O99119 Other diseases of the blood and blood-forming organs and certain disorders involving the immune mechanism complicating pregnancy, unspecified trimester: Secondary | ICD-10-CM | POA: Diagnosis present

## 2017-05-08 DIAGNOSIS — Z148 Genetic carrier of other disease: Secondary | ICD-10-CM

## 2017-05-08 DIAGNOSIS — O9081 Anemia of the puerperium: Secondary | ICD-10-CM | POA: Diagnosis present

## 2017-05-08 DIAGNOSIS — Z302 Encounter for sterilization: Secondary | ICD-10-CM | POA: Diagnosis not present

## 2017-05-08 DIAGNOSIS — Z8759 Personal history of other complications of pregnancy, childbirth and the puerperium: Secondary | ICD-10-CM

## 2017-05-08 HISTORY — PX: UNILATERAL SALPINGECTOMY: SHX6160

## 2017-05-08 LAB — CBC
HEMATOCRIT: 30.1 % — AB (ref 36.0–46.0)
Hemoglobin: 10.2 g/dL — ABNORMAL LOW (ref 12.0–15.0)
MCH: 31.7 pg (ref 26.0–34.0)
MCHC: 33.9 g/dL (ref 30.0–36.0)
MCV: 93.5 fL (ref 78.0–100.0)
Platelets: 106 10*3/uL — ABNORMAL LOW (ref 150–400)
RBC: 3.22 MIL/uL — AB (ref 3.87–5.11)
RDW: 13.9 % (ref 11.5–15.5)
WBC: 8 10*3/uL (ref 4.0–10.5)

## 2017-05-08 LAB — RPR: RPR Ser Ql: NONREACTIVE

## 2017-05-08 SURGERY — Surgical Case
Anesthesia: Spinal

## 2017-05-08 MED ORDER — SIMETHICONE 80 MG PO CHEW
80.0000 mg | CHEWABLE_TABLET | ORAL | Status: DC | PRN
Start: 2017-05-08 — End: 2017-05-11

## 2017-05-08 MED ORDER — KETOROLAC TROMETHAMINE 30 MG/ML IJ SOLN: 30.0000 mg | Freq: Four times a day (QID) | INTRAMUSCULAR | Status: DC | PRN

## 2017-05-08 MED ORDER — LACTATED RINGERS IV SOLN
INTRAVENOUS | Status: DC
Start: 2017-05-08 — End: 2017-05-09
  Administered 2017-05-08: 21:00:00 via INTRAVENOUS

## 2017-05-08 MED ORDER — ALBUMIN HUMAN 5 % IV SOLN
INTRAVENOUS | Status: DC | PRN
  Administered 2017-05-08: 15:00:00 via INTRAVENOUS

## 2017-05-08 MED ORDER — DIPHENHYDRAMINE HCL 50 MG/ML IJ SOLN
12.5000 mg | INTRAMUSCULAR | Status: DC | PRN
  Administered 2017-05-08 – 2017-05-09 (×2): 12.5 mg via INTRAVENOUS
  Filled 2017-05-08 (×2): qty 1

## 2017-05-08 MED ORDER — BUPIVACAINE IN DEXTROSE 0.75-8.25 % IT SOLN
INTRATHECAL | Status: DC | PRN
  Administered 2017-05-08: 1.4 mL via INTRATHECAL

## 2017-05-08 MED ORDER — MORPHINE SULFATE (PF) 0.5 MG/ML IJ SOLN
INTRAMUSCULAR | Status: DC | PRN
  Administered 2017-05-08: .2 mg via INTRATHECAL

## 2017-05-08 MED ORDER — LACTATED RINGERS IV SOLN: INTRAVENOUS | Status: DC

## 2017-05-08 MED ORDER — ONDANSETRON HCL 4 MG/2ML IJ SOLN
4.0000 mg | Freq: Three times a day (TID) | INTRAMUSCULAR | Status: DC | PRN
Start: 2017-05-08 — End: 2017-05-09

## 2017-05-08 MED ORDER — METOCLOPRAMIDE HCL 5 MG/ML IJ SOLN: 10.0000 mg | Freq: Once | INTRAMUSCULAR | Status: DC | PRN

## 2017-05-08 MED ORDER — NALBUPHINE HCL 10 MG/ML IJ SOLN: 5.0000 mg | INTRAMUSCULAR | Status: DC | PRN

## 2017-05-08 MED ORDER — ACETAMINOPHEN 325 MG PO TABS
650.0000 mg | ORAL_TABLET | ORAL | Status: DC | PRN
  Administered 2017-05-09: 650 mg via ORAL
  Filled 2017-05-08 (×2): qty 2

## 2017-05-08 MED ORDER — LACTATED RINGERS IV SOLN
INTRAVENOUS | Status: DC | PRN
  Administered 2017-05-08: 14:00:00 via INTRAVENOUS

## 2017-05-08 MED ORDER — SODIUM CHLORIDE 0.9 % IR SOLN
Status: DC | PRN
  Administered 2017-05-08: 1000 mL

## 2017-05-08 MED ORDER — PHENYLEPHRINE 8 MG IN D5W 100 ML (0.08MG/ML) PREMIX OPTIME
INJECTION | INTRAVENOUS | Status: AC
  Filled 2017-05-08: qty 100

## 2017-05-08 MED ORDER — NALOXONE HCL 2 MG/2ML IJ SOSY: 1.0000 ug/kg/h | PREFILLED_SYRINGE | INTRAVENOUS | Status: DC | PRN

## 2017-05-08 MED ORDER — OXYTOCIN 10 UNIT/ML IJ SOLN
INTRAVENOUS | Status: DC | PRN
  Administered 2017-05-08: 40 [IU] via INTRAVENOUS

## 2017-05-08 MED ORDER — FENTANYL CITRATE (PF) 100 MCG/2ML IJ SOLN
INTRAMUSCULAR | Status: DC | PRN
  Administered 2017-05-08: 10 ug via INTRATHECAL

## 2017-05-08 MED ORDER — ACETAMINOPHEN 500 MG PO TABS
1000.0000 mg | ORAL_TABLET | Freq: Four times a day (QID) | ORAL | Status: DC
  Administered 2017-05-08 – 2017-05-09 (×3): 1000 mg via ORAL
  Filled 2017-05-08 (×3): qty 2

## 2017-05-08 MED ORDER — PRENATAL MULTIVITAMIN CH
1.0000 | ORAL_TABLET | Freq: Every day | ORAL | Status: DC
  Administered 2017-05-09 – 2017-05-11 (×3): 1 via ORAL
  Filled 2017-05-08 (×3): qty 1

## 2017-05-08 MED ORDER — SIMETHICONE 80 MG PO CHEW
80.0000 mg | CHEWABLE_TABLET | Freq: Three times a day (TID) | ORAL | Status: DC
  Administered 2017-05-09 – 2017-05-11 (×6): 80 mg via ORAL
  Filled 2017-05-08 (×7): qty 1

## 2017-05-08 MED ORDER — MENTHOL 3 MG MT LOZG: 1.0000 | LOZENGE | OROMUCOSAL | Status: DC | PRN

## 2017-05-08 MED ORDER — NALBUPHINE HCL 10 MG/ML IJ SOLN: 5.0000 mg | Freq: Once | INTRAMUSCULAR | Status: DC | PRN

## 2017-05-08 MED ORDER — NITROFURANTOIN MACROCRYSTAL 100 MG PO CAPS
100.0000 mg | ORAL_CAPSULE | Freq: Every day | ORAL | Status: DC
  Filled 2017-05-08: qty 1

## 2017-05-08 MED ORDER — ZOLPIDEM TARTRATE 5 MG PO TABS: 5.0000 mg | ORAL_TABLET | Freq: Every evening | ORAL | Status: DC | PRN

## 2017-05-08 MED ORDER — KETOROLAC TROMETHAMINE 30 MG/ML IJ SOLN
INTRAMUSCULAR | Status: AC
  Filled 2017-05-08: qty 1

## 2017-05-08 MED ORDER — INFLUENZA VAC SPLIT QUAD 0.5 ML IM SUSY
0.5000 mL | PREFILLED_SYRINGE | INTRAMUSCULAR | Status: DC
  Filled 2017-05-08: qty 0.5

## 2017-05-08 MED ORDER — GENTAMICIN SULFATE 40 MG/ML IJ SOLN
INTRAVENOUS | Status: AC
  Administered 2017-05-08: 113.75 mL via INTRAVENOUS
  Filled 2017-05-08: qty 7.75

## 2017-05-08 MED ORDER — SCOPOLAMINE 1 MG/3DAYS TD PT72
1.0000 | MEDICATED_PATCH | Freq: Once | TRANSDERMAL | Status: DC
  Administered 2017-05-08: 1.5 mg via TRANSDERMAL
  Filled 2017-05-08: qty 1

## 2017-05-08 MED ORDER — DIBUCAINE 1 % RE OINT: 1.0000 "application " | TOPICAL_OINTMENT | RECTAL | Status: DC | PRN

## 2017-05-08 MED ORDER — DEXAMETHASONE SODIUM PHOSPHATE 10 MG/ML IJ SOLN
INTRAMUSCULAR | Status: DC | PRN
  Administered 2017-05-08: 8 mg via INTRAVENOUS

## 2017-05-08 MED ORDER — SIMETHICONE 80 MG PO CHEW
80.0000 mg | CHEWABLE_TABLET | ORAL | Status: DC
  Administered 2017-05-08 – 2017-05-10 (×3): 80 mg via ORAL
  Filled 2017-05-08 (×3): qty 1

## 2017-05-08 MED ORDER — WITCH HAZEL-GLYCERIN EX PADS: 1.0000 "application " | MEDICATED_PAD | CUTANEOUS | Status: DC | PRN

## 2017-05-08 MED ORDER — LACTATED RINGERS IV SOLN
INTRAVENOUS | Status: DC
  Administered 2017-05-08 (×3): via INTRAVENOUS

## 2017-05-08 MED ORDER — FENTANYL CITRATE (PF) 100 MCG/2ML IJ SOLN
INTRAMUSCULAR | Status: AC
  Filled 2017-05-08: qty 2

## 2017-05-08 MED ORDER — MORPHINE SULFATE (PF) 0.5 MG/ML IJ SOLN
INTRAMUSCULAR | Status: AC
  Filled 2017-05-08: qty 10

## 2017-05-08 MED ORDER — ALBUMIN HUMAN 5 % IV SOLN
INTRAVENOUS | Status: AC
  Filled 2017-05-08: qty 250

## 2017-05-08 MED ORDER — SENNOSIDES-DOCUSATE SODIUM 8.6-50 MG PO TABS
2.0000 | ORAL_TABLET | ORAL | Status: DC
  Administered 2017-05-08 – 2017-05-10 (×3): 2 via ORAL
  Filled 2017-05-08 (×3): qty 2

## 2017-05-08 MED ORDER — ONDANSETRON HCL 4 MG/2ML IJ SOLN
INTRAMUSCULAR | Status: AC
  Filled 2017-05-08: qty 2

## 2017-05-08 MED ORDER — DIPHENHYDRAMINE HCL 25 MG PO CAPS: 25.0000 mg | ORAL_CAPSULE | Freq: Four times a day (QID) | ORAL | Status: DC | PRN

## 2017-05-08 MED ORDER — ONDANSETRON HCL 4 MG/2ML IJ SOLN
INTRAMUSCULAR | Status: DC | PRN
  Administered 2017-05-08: 4 mg via INTRAVENOUS

## 2017-05-08 MED ORDER — IBUPROFEN 600 MG PO TABS
600.0000 mg | ORAL_TABLET | Freq: Four times a day (QID) | ORAL | Status: DC
  Administered 2017-05-08 – 2017-05-11 (×11): 600 mg via ORAL
  Filled 2017-05-08 (×11): qty 1

## 2017-05-08 MED ORDER — DIPHENHYDRAMINE HCL 25 MG PO CAPS: 25.0000 mg | ORAL_CAPSULE | ORAL | Status: DC | PRN

## 2017-05-08 MED ORDER — NITROFURANTOIN MONOHYD MACRO 100 MG PO CAPS
100.0000 mg | ORAL_CAPSULE | Freq: Every day | ORAL | Status: DC
  Administered 2017-05-09 – 2017-05-10 (×2): 100 mg via ORAL
  Filled 2017-05-08 (×3): qty 1

## 2017-05-08 MED ORDER — KETOROLAC TROMETHAMINE 30 MG/ML IJ SOLN
30.0000 mg | Freq: Four times a day (QID) | INTRAMUSCULAR | Status: DC | PRN
  Administered 2017-05-08: 30 mg via INTRAMUSCULAR

## 2017-05-08 MED ORDER — TETANUS-DIPHTH-ACELL PERTUSSIS 5-2.5-18.5 LF-MCG/0.5 IM SUSP: 0.5000 mL | Freq: Once | INTRAMUSCULAR | Status: DC

## 2017-05-08 MED ORDER — MEPERIDINE HCL 25 MG/ML IJ SOLN: 6.2500 mg | INTRAMUSCULAR | Status: DC | PRN

## 2017-05-08 MED ORDER — OXYTOCIN 40 UNITS IN LACTATED RINGERS INFUSION - SIMPLE MED: 2.5000 [IU]/h | INTRAVENOUS | Status: DC

## 2017-05-08 MED ORDER — FENTANYL CITRATE (PF) 100 MCG/2ML IJ SOLN: 25.0000 ug | INTRAMUSCULAR | Status: DC | PRN

## 2017-05-08 MED ORDER — DEXAMETHASONE SODIUM PHOSPHATE 10 MG/ML IJ SOLN
INTRAMUSCULAR | Status: AC
  Filled 2017-05-08: qty 1

## 2017-05-08 MED ORDER — PHENYLEPHRINE HCL 10 MG/ML IJ SOLN
INTRAMUSCULAR | Status: DC | PRN
  Administered 2017-05-08 (×2): 40 ug via INTRAVENOUS
  Administered 2017-05-08: 4 ug via INTRAVENOUS
  Administered 2017-05-08: 40 ug via INTRAVENOUS

## 2017-05-08 MED ORDER — BUPIVACAINE IN DEXTROSE 0.75-8.25 % IT SOLN
INTRATHECAL | Status: AC
  Filled 2017-05-08: qty 2

## 2017-05-08 MED ORDER — INFLUENZA VAC SPLIT QUAD 0.5 ML IM SUSY
0.5000 mL | PREFILLED_SYRINGE | INTRAMUSCULAR | Status: AC
  Administered 2017-05-10: 0.5 mL via INTRAMUSCULAR
  Filled 2017-05-08: qty 0.5

## 2017-05-08 MED ORDER — PHENYLEPHRINE 8 MG IN D5W 100 ML (0.08MG/ML) PREMIX OPTIME
INJECTION | INTRAVENOUS | Status: DC | PRN
  Administered 2017-05-08: 60 ug/min via INTRAVENOUS

## 2017-05-08 MED ORDER — OXYTOCIN 10 UNIT/ML IJ SOLN
INTRAMUSCULAR | Status: AC
  Filled 2017-05-08: qty 4

## 2017-05-08 MED ORDER — NALOXONE HCL 0.4 MG/ML IJ SOLN: 0.4000 mg | INTRAMUSCULAR | Status: DC | PRN

## 2017-05-08 MED ORDER — SODIUM CHLORIDE 0.9% FLUSH: 3.0000 mL | INTRAVENOUS | Status: DC | PRN

## 2017-05-08 MED ORDER — COCONUT OIL OIL: 1.0000 "application " | TOPICAL_OIL | Status: DC | PRN

## 2017-05-08 MED ORDER — NALBUPHINE HCL 10 MG/ML IJ SOLN
5.0000 mg | INTRAMUSCULAR | Status: DC | PRN
  Administered 2017-05-08: 5 mg via INTRAVENOUS
  Filled 2017-05-08: qty 1

## 2017-05-08 SURGICAL SUPPLY — 38 items
BENZOIN TINCTURE PRP APPL 2/3 (GAUZE/BANDAGES/DRESSINGS) ×4 IMPLANT
CHLORAPREP W/TINT 26ML (MISCELLANEOUS) ×4 IMPLANT
CLAMP CORD UMBIL (MISCELLANEOUS) IMPLANT
CLOSURE WOUND 1/2 X4 (GAUZE/BANDAGES/DRESSINGS) ×1
CLOTH BEACON ORANGE TIMEOUT ST (SAFETY) ×4 IMPLANT
CONTAINER PREFILL 10% NBF 15ML (MISCELLANEOUS) IMPLANT
CONTAINER PREFILL 10% NBF 60ML (FORM) ×4 IMPLANT
DRSG OPSITE POSTOP 4X10 (GAUZE/BANDAGES/DRESSINGS) ×4 IMPLANT
ELECT REM PT RETURN 9FT ADLT (ELECTROSURGICAL) ×4
ELECTRODE REM PT RTRN 9FT ADLT (ELECTROSURGICAL) ×2 IMPLANT
EXTRACTOR VACUUM M CUP 4 TUBE (SUCTIONS) IMPLANT
EXTRACTOR VACUUM M CUP 4' TUBE (SUCTIONS)
GLOVE BIO SURGEON STRL SZ 6.5 (GLOVE) ×3 IMPLANT
GLOVE BIO SURGEONS STRL SZ 6.5 (GLOVE) ×1
GLOVE BIOGEL PI IND STRL 7.0 (GLOVE) ×4 IMPLANT
GLOVE BIOGEL PI INDICATOR 7.0 (GLOVE) ×4
GOWN STRL REUS W/TWL LRG LVL3 (GOWN DISPOSABLE) ×8 IMPLANT
HEMOSTAT ARISTA ABSORB 3G PWDR (MISCELLANEOUS) ×4 IMPLANT
KIT ABG SYR 3ML LUER SLIP (SYRINGE) IMPLANT
NEEDLE HYPO 22GX1.5 SAFETY (NEEDLE) IMPLANT
NEEDLE HYPO 25X5/8 SAFETYGLIDE (NEEDLE) IMPLANT
NS IRRIG 1000ML POUR BTL (IV SOLUTION) ×4 IMPLANT
PACK C SECTION WH (CUSTOM PROCEDURE TRAY) ×4 IMPLANT
PAD OB MATERNITY 4.3X12.25 (PERSONAL CARE ITEMS) ×4 IMPLANT
PENCIL SMOKE EVAC W/HOLSTER (ELECTROSURGICAL) ×4 IMPLANT
STRIP CLOSURE SKIN 1/2X4 (GAUZE/BANDAGES/DRESSINGS) ×3 IMPLANT
SUT CHROMIC GUT AB #0 18 (SUTURE) ×4 IMPLANT
SUT MON AB 4-0 PS1 27 (SUTURE) ×4 IMPLANT
SUT PLAIN 0 NONE (SUTURE) IMPLANT
SUT PLAIN 2 0 XLH (SUTURE) IMPLANT
SUT VIC AB 0 CT1 36 (SUTURE) ×12 IMPLANT
SUT VIC AB 0 CTX 36 (SUTURE) ×4
SUT VIC AB 0 CTX36XBRD ANBCTRL (SUTURE) ×4 IMPLANT
SUT VIC AB 2-0 CT1 27 (SUTURE) ×6
SUT VIC AB 2-0 CT1 TAPERPNT 27 (SUTURE) ×6 IMPLANT
SYR CONTROL 10ML LL (SYRINGE) IMPLANT
TOWEL OR 17X24 6PK STRL BLUE (TOWEL DISPOSABLE) ×4 IMPLANT
TRAY FOLEY BAG SILVER LF 14FR (SET/KITS/TRAYS/PACK) IMPLANT

## 2017-05-08 NOTE — Lactation Note (Signed)
This note was copied from a baby's chart. Lactation Consultation Note  Patient Name: Lisa Kim [id number] Date: 05/08/2017 Reason for consult: Initial assessment;Breast augmentation  Baby 6 hours old. Mom just closing her eyes to rest when this LC entered the room. FOB holding baby. Mom reports that she nursed her first child for 4-5 months. Another visitor in the room, did not ask whether breast surgery before or after first child. Mom given [facility] brochure, and enc to nurse with cues and to call for assistance as needed.   Maternal Data Does the patient have breastfeeding experience prior to this delivery?: Yes  Feeding Feeding Type: Breast Fed Length of feed: 20 min  LATCH Score                   Interventions    Lactation Tools Discussed/Used     Consult Status Consult Status: Follow-up Date: 05/09/17 Follow-up type: In-patient    Sherlyn Hay 05/08/2017, 8:14 PM

## 2017-05-08 NOTE — H&P (Signed)
31 y.o. [id number] at [redacted]w[redacted]d presenting for RCS with salpingectomy. Pt notes no contractions. Good fetal movement, No vaginal bleeding, not leaking fluid .  [facility] at [facility] since 1st trimester   Prenatal Transfer Tool  Maternal Diabetes: No Genetic Screening: Normal Maternal Ultrasounds/Referrals: Normal Fetal Ultrasounds or other Referrals:  None Maternal Substance Abuse:  No Significant Maternal Medications:  None Significant Maternal Lab Results: None     OB History    Gravida Para Term Preterm AB Living   SAB TAB Ectopic Multiple Live Births   Obstetric Comments   SAB after fertility      Past Medical History:  Diagnosis Date  . Fragile X syndrome   . Pregnant    Past Surgical History:  Procedure Laterality Date  . APPENDECTOMY    . BREAST SURGERY     augmentation  . CESAREAN SECTION    . CLAVICLE SURGERY    . ECTOPIC PREGNANCY SURGERY     Family History: family history includes Breast cancer in her maternal grandmother; Diabetes type II in her father; Fragile X syndrome in her son; Heart attack in her paternal grandfather; Migraines in her mother; Throat cancer in her paternal grandmother. Social History:  reports that she has never smoked. She has never used smokeless tobacco. She reports that she does not drink alcohol or use drugs.  Review of Systems - Negative except discomfort of pregnancy     Blood pressure 110/77, pulse 78, temperature 98.6 F (37 C), temperature source Oral, resp. rate 16, last menstrual period 08/14/2016.  Physical Exam:  Gen: well appearing, no distress  Back: no CVAT Abd: gravid, NT, no RUQ pain LE: no edema, equal bilaterally, non-tender   Prenatal labs: ABO, Rh: --/--/A NEG (10/01 0935) Antibody: POS (10/01 0935) Rubella: 2.15 (04/04 1628) RPR: Non Reactive (10/01 0935)  HBsAg: Negative (04/04 1628)  HIV:   neg GBS: Negative (09/10 0000)  1 hr Glucola normal  Genetic screening nl  qaud Anatomy US normal   Assessment/Plan: 31 y.o. [id number] at [redacted]w[redacted]d RCS. R/B d/w pt Undesired fertility, pt sure of desire, reversible options reviewed w/ pt. Plan TL vs salpingectomy, plan latter if feasible, prior R salpingectomy for ectopic preg. PCN allergy, itching, OK for Ancef Fragile X risk, pt known carrier, has affected child Pyelonephritis, on daiy Macrobid, will cont 6 wks PP A neg, consider Rhogam  Karaline Buresh A. 05/08/2017 1:00 PM     Alesana Magistro A. 05/08/2017, 12:57 PM

## 2017-05-08 NOTE — Brief Op Note (Signed)
05/08/2017  4:03 PM  PATIENT:  Lisa Kim  31 y.o. female  PRE-OPERATIVE DIAGNOSIS:  Previous Cesarean Section--desires sterility  POST-OPERATIVE DIAGNOSIS:  Previous Cesarean Section--desires sterility  PROCEDURE:  Procedure(s) with comments: Repeat CESAREAN SECTION (N/A) - EDD: 05/13/17 Allergy: Penicillin, Promethazine UNILATERAL SALPINGECTOMY (Left)  LTCS w/ 2 layer closure.   SURGEON:  Surgeon(s) and Role:    * Noland Fordyce, MD - Primary  PHYSICIAN ASSISTANT:   ASSISTANTS: Sigmon, CNM   ANESTHESIA:   spinal  EBL:  Total I/O In: 3650 [I.V.:3400; IV Piggyback:250] Out: 1073 [Urine:150; Blood:923]  BLOOD ADMINISTERED:none  DRAINS: Urinary Catheter (Foley)   LOCAL MEDICATIONS USED:  NONE  SPECIMEN:  Source of Specimen:  placenta  DISPOSITION OF SPECIMEN:  L&D  COUNTS:  YES  TOURNIQUET:  * No tourniquets in log *  DICTATION: .Note written in EPIC  PLAN OF CARE: Admit to inpatient   PATIENT DISPOSITION:  PACU - hemodynamically stable.   Delay start of Pharmacological VTE agent (>24hrs) due to surgical blood loss or risk of bleeding: yes

## 2017-05-08 NOTE — Anesthesia Procedure Notes (Signed)
Spinal  Patient location during procedure: OR Staffing Anesthesiologist: Kingsten Enfield Performed: anesthesiologist  Preanesthetic Checklist Completed: patient identified, site marked, surgical consent, pre-op evaluation, timeout performed, IV checked, risks and benefits discussed and monitors and equipment checked Spinal Block Patient position: sitting Prep: DuraPrep Patient monitoring: heart rate, continuous pulse ox and blood pressure Approach: right paramedian Location: L3-4 Injection technique: single-shot Needle Needle type: Sprotte  Needle gauge: 24 G Needle length: 9 cm Additional Notes Expiration date of kit checked and confirmed. Patient tolerated procedure well, without complications.       

## 2017-05-08 NOTE — Transfer of Care (Signed)
Immediate Anesthesia Transfer of Care Note  Patient: Lisa Kim  Procedure(s) Performed: Repeat CESAREAN SECTION (N/A ) UNILATERAL SALPINGECTOMY (Left )  Patient Location: PACU  Anesthesia Type:Spinal  Level of Consciousness: awake  Airway & Oxygen Therapy: Patient Spontanous Breathing  Post-op Assessment: Report given to RN  Post vital signs: Reviewed and stable  Last Vitals:  Vitals:   05/08/17 1127  BP: 110/77  Pulse: 78  Resp: 16  Temp: 37 C    Last Pain:  Vitals:   05/08/17 1127  TempSrc: Oral         Complications: No apparent anesthesia complications

## 2017-05-08 NOTE — Anesthesia Preprocedure Evaluation (Signed)
Anesthesia Evaluation  Patient identified by MRN, date of birth, ID band Patient awake    Reviewed: Allergy & Precautions, NPO status , Patient's Chart, lab work & pertinent test results  Airway Mallampati: II  TM Distance: >3 FB Neck ROM: Full    Dental no notable dental hx.    Pulmonary neg pulmonary ROS,    Pulmonary exam normal breath sounds clear to auscultation       Cardiovascular negative cardio ROS Normal cardiovascular exam Rhythm:Regular Rate:Normal     Neuro/Psych negative neurological ROS  negative psych ROS   GI/Hepatic negative GI ROS, Neg liver ROS,   Endo/Other  negative endocrine ROS  Renal/GU negative Renal ROS  negative genitourinary   Musculoskeletal negative musculoskeletal ROS (+)   Abdominal   Peds negative pediatric ROS (+)  Hematology negative hematology ROS (+)   Anesthesia Other Findings   Reproductive/Obstetrics (+) Pregnancy                             Anesthesia Physical Anesthesia Plan  ASA: II  Anesthesia Plan: Spinal   Post-op Pain Management:    Induction:   PONV Risk Score and Plan: 3 and Ondansetron and Treatment may vary due to age or medical condition  Airway Management Planned:   Additional Equipment:   Intra-op Plan:   Post-operative Plan:   Informed Consent: I have reviewed the patients History and Physical, chart, labs and discussed the procedure including the risks, benefits and alternatives for the proposed anesthesia with the patient or authorized representative who has indicated his/her understanding and acceptance.   Dental advisory given  Plan Discussed with: CRNA  Anesthesia Plan Comments:         Anesthesia Quick Evaluation

## 2017-05-08 NOTE — Op Note (Signed)
05/08/2017  4:03 PM  PATIENT:  Lisa Kim  31 y.o. female  PRE-OPERATIVE DIAGNOSIS:  Previous Cesarean Section--desires sterility  POST-OPERATIVE DIAGNOSIS:  Previous Cesarean Section--desires sterility  PROCEDURE:  Procedure(s) with comments: Repeat CESAREAN SECTION (N/A) - EDD: 05/13/17 Allergy: Penicillin, Promethazine UNILATERAL SALPINGECTOMY (Left)  LTCS w/ 2 layer closure.   SURGEON:  Surgeon(s) and Role:    * Noland Fordyce, MD - Primary  PHYSICIAN ASSISTANT:   ASSISTANTS: Sigmon, CNM   ANESTHESIA:   spinal  EBL:  Total I/O In: 3650 [I.V.:3400; IV Piggyback:250] Out: 1073 [Urine:150; Blood:923]  BLOOD ADMINISTERED:none  DRAINS: Urinary Catheter (Foley)   LOCAL MEDICATIONS USED:  NONE  SPECIMEN:  Source of Specimen:  placenta  DISPOSITION OF SPECIMEN:  L&D  COUNTS:  YES  TOURNIQUET:  * No tourniquets in log *  DICTATION: .Note written in EPIC  PLAN OF CARE: Admit to inpatient   PATIENT DISPOSITION:  PACU - hemodynamically stable.   Delay start of Pharmacological VTE agent (>24hrs) due to surgical blood loss or risk of bleeding: yes    Findings:  @ infant,  APGAR (1 MIN): 6   APGAR (5 MINS): 7   APGAR (10 MINS): 9   Normal uterus,  and ovaries, normal placenta. 3VC, clear amniotic fluid, R tube absent, normal L tube  EBL: 950 cc Antibiotics:  Gent/ Clinda Complications: none  Indications: This is a 31 y.o. year-old, G2P1  At [redacted]w[redacted]d admitted for RCS and L salpingectomy. Risks benefits and alternatives of the procedure were discussed with the patient who agreed to proceed  Procedure:  After informed consent was obtained the patient was taken to the operating room where spinal anesthesia was initiated.  She was prepped and draped in the normal sterile fashion in dorsal supine position with a leftward tilt.  A foley catheter was in place.  A Pfannenstiel skin incision was made 2 cm above the pubic symphysis in the midline with the scalpel.   Dissection was carried down with the Bovie cautery until the fascia was reached. The fascia was incised in the midline. The incision was extended laterally with the Mayo scissors. The inferior aspect of the fascial incision was grasped with the Coker clamps, elevated up and the underlying rectus muscles were dissected off sharply. The superior aspect of the fascial incision was grasped with the Coker clamps elevated up and the underlying rectus muscles were dissected off sharply.  The peritoneum was entered bluntly. The peritoneal incision was extended superiorly and inferiorly with good visualization of the bladder. The bladder blade was inserted and palpation was done to assess the fetal position and the location of the uterine vessels.  A bladder flap was created sharply. The lower segment of the uterus was incised sharply with the scalpel and extended  bluntly in the cephalo-caudal fashion. The infant was grasped, brought to the incision,  rotated and the infant was delivered with fundal pressure. The nose and mouth were bulb suctioned. The cord was clamped and cut after 1 minute delay. The infant was handed off to the waiting pediatrician. The placenta was expressed. The uterus was exteriorized. The uterus was cleared of all clots and debris. The uterine incision was repaired with 0 Vicryl in a running locked fashion.  A second layer of the same suture was used in an imbricating fashion to obtain excellent hemostasis.  The tubes and ovaries were inspected. Right tube absent. L tube identified, Using a Kelley clamp and bovie cautery, the meosalpynx was serially clamped, cauterized for  trnasection and tied with 2-0 vicryl. The tube was then removed. Hemostasis was seen.  The uterus was then returned to the abdomen, the gutters were cleared of all clots and debris. The uterine incision was reinspected and found to be bleeding. 2 additional figure of 8 sutures were placed and then the incision was hemostatic. Some  bleeding was noted from the bladder flap, the foley bulb was identified, the bladder was intact, no blood in the foley catherter. Arista was used to help with hemostasis. The peritoneum was grasped and closed with 2-0 Vicryl in a running fashion. The cut muscle edges and the underside of the fascia were inspected and found to be hemostatic. The fascia was closed with 0 Vicryl in a single layer . The subcutaneous tissue was irrigated. Scarpa's layer was closed with a 2-0 plain gut suture. The skin was closed with a 4-0 Monocryl in a single layer. The patient tolerated the procedure well. Sponge lap and needle counts were correct x3 and patient was taken to the recovery room in a stable condition.  Lisa Kim A. 05/08/2017 4:07 PM

## 2017-05-09 LAB — CBC
HEMATOCRIT: 24.5 % — AB (ref 36.0–46.0)
Hemoglobin: 8.7 g/dL — ABNORMAL LOW (ref 12.0–15.0)
MCH: 32.6 pg (ref 26.0–34.0)
MCHC: 35.5 g/dL (ref 30.0–36.0)
MCV: 91.8 fL (ref 78.0–100.0)
PLATELETS: 108 10*3/uL — AB (ref 150–400)
RBC: 2.67 MIL/uL — ABNORMAL LOW (ref 3.87–5.11)
RDW: 13.9 % (ref 11.5–15.5)
WBC: 9.6 10*3/uL (ref 4.0–10.5)

## 2017-05-09 LAB — BIRTH TISSUE RECOVERY COLLECTION (PLACENTA DONATION)

## 2017-05-09 MED ORDER — POLYSACCHARIDE IRON COMPLEX 150 MG PO CAPS
150.0000 mg | ORAL_CAPSULE | Freq: Every day | ORAL | Status: DC
  Administered 2017-05-09 – 2017-05-11 (×3): 150 mg via ORAL
  Filled 2017-05-09 (×3): qty 1

## 2017-05-09 MED ORDER — OXYCODONE HCL 5 MG PO TABS: 5.0000 mg | ORAL_TABLET | ORAL | Status: DC | PRN

## 2017-05-09 MED ORDER — OXYCODONE HCL 5 MG PO TABS
10.0000 mg | ORAL_TABLET | ORAL | Status: DC | PRN
  Administered 2017-05-09 – 2017-05-11 (×7): 10 mg via ORAL
  Filled 2017-05-09 (×7): qty 2

## 2017-05-09 MED ORDER — RHO D IMMUNE GLOBULIN 1500 UNIT/2ML IJ SOSY
300.0000 ug | PREFILLED_SYRINGE | Freq: Once | INTRAMUSCULAR | Status: AC
  Administered 2017-05-09: 300 ug via INTRAVENOUS
  Filled 2017-05-09: qty 2

## 2017-05-09 MED ORDER — MAGNESIUM OXIDE 400 (241.3 MG) MG PO TABS
400.0000 mg | ORAL_TABLET | Freq: Every day | ORAL | Status: DC
  Administered 2017-05-09 – 2017-05-10 (×2): 400 mg via ORAL
  Filled 2017-05-09 (×3): qty 1

## 2017-05-09 NOTE — Progress Notes (Signed)
POSTOPERATIVE DAY # 1 S/P repeat CS with left salpingectomy (R-previous removed)   S:         Reports feeling a lot of gas pain and shoulder pain this am             Tolerating po intake / no nausea / no vomiting / no flatus / no BM             Bleeding is light             Pain controlled with motrin             Up ad lib / ambulatory/ voiding QS  Newborn breast feeding  / Circumcision planned  O:  VS: BP (!) 91/56 (BP Location: Right Arm)   Pulse (!) 59   Temp 97.8 F (36.6 C) (Axillary)   Resp 16   LMP 08/14/2016   SpO2 100%   Breastfeeding? Unknown    LABS:               Recent Labs  05/08/17 1156 05/09/17 0622  WBC 8.0 9.6  HGB 10.2* 8.7*  PLT 106* 108*               Bloodtype: --/--/A NEG (10/01 0935) / newborn AB positive  Rubella: Immune                tdap 2018              No flu vaccine yet                                     I&O: Intake/Output      10/02 0701 - 10/03 0700 10/03 0701 - 10/04 0700   I.V. 4389    IV Piggyback 250    Total Intake 4639     Urine 2150    Blood 923    Total Output 3073     Net +1566                     Physical Exam:             Alert and Oriented X3  Lungs: Clear and unlabored  Heart: regular rate and rhythm / no mumurs  Abdomen: soft, non-tender, mildly distended, hypoactive BS             Fundus: firm, non-tender, Ueven             Dressing intact              Incision:  approximated with suture / no erythema / no ecchymosis / no drainage  Perineum: intact  Lochia: light  Extremities: no edema, no calf pain or tenderness  A:        POD # 1 S/P repeat CS             IDA with ABL anemia              RH negative / newborn Rh positive             Gas pain  P:        Routine postoperative care              Promote bowel motility             Give rhogam             Offer  flu vaccine             Iron and magnesium   Marlinda Mike CNM, MSN, [facility] 05/09/2017, 8:56 AM

## 2017-05-09 NOTE — Anesthesia Postprocedure Evaluation (Signed)
Anesthesia Post Note  Patient: Lisa Kim  Procedure(s) Performed: Repeat CESAREAN SECTION (N/A ) UNILATERAL SALPINGECTOMY (Left )     Patient location during evaluation: PACU Anesthesia Type: Spinal Level of consciousness: oriented and awake and alert Pain management: pain level controlled Vital Signs Assessment: post-procedure vital signs reviewed and stable Respiratory status: spontaneous breathing, respiratory function stable and patient connected to nasal cannula oxygen Cardiovascular status: blood pressure returned to baseline and stable Postop Assessment: no headache, no backache and no apparent nausea or vomiting Anesthetic complications: no    Last Vitals:  Vitals:   05/08/17 1835 05/08/17 2318  BP: (!) 102/59 (!) 91/56  Pulse: (!) 53 (!) 59  Resp: 16 16  Temp: 36.5 C 36.6 C  SpO2: 100% 100%    Last Pain:  Vitals:   05/09/17 0629  TempSrc:   PainSc: 0-No pain   Pain Goal:                 [employer]

## 2017-05-10 LAB — RH IG WORKUP (INCLUDES ABO/RH)
ABO/RH(D): A NEG
Fetal Screen: NEGATIVE
Gestational Age(Wks): 39.2
UNIT DIVISION: 0

## 2017-05-10 MED ORDER — OXYCODONE-ACETAMINOPHEN 5-325 MG PO TABS: 1.0000 | ORAL_TABLET | Freq: Four times a day (QID) | ORAL | 0 refills | Status: AC | PRN

## 2017-05-10 MED ORDER — IBUPROFEN 600 MG PO TABS: 600.0000 mg | ORAL_TABLET | Freq: Four times a day (QID) | ORAL | 0 refills | Status: AC

## 2017-05-10 MED ORDER — MAGNESIUM OXIDE 400 (241.3 MG) MG PO TABS: 400.0000 mg | ORAL_TABLET | Freq: Every day | ORAL | 1 refills | Status: AC

## 2017-05-10 MED ORDER — POLYSACCHARIDE IRON COMPLEX 150 MG PO CAPS: 150.0000 mg | ORAL_CAPSULE | Freq: Every day | ORAL | 1 refills | Status: AC

## 2017-05-10 NOTE — Discharge Summary (Signed)
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 yo [id number] at [redacted]w[redacted]d was admitted to the hospital 05/08/2017 for scheduled cesarean section with the following indication:Elective Repeat.  Membrane Rupture Time/Date: 2:11 PM ,05/08/2017   Patient delivered a Viable infant.05/08/2017  Details of operation can be found in separate operative note.  Pateint had an uncomplicated postpartum course.  She is ambulating, tolerating a regular diet, passing flatus, and urinating well. Patient is discharged home in stable condition on  05/10/17         Physical exam  Vitals:   05/08/17 2318 05/09/17 0900 05/09/17 1755 05/10/17 0538  BP: (!) 91/56 (!) 75/46 (!) 91/56 (!) 95/58  Pulse: (!) 59 (!) 58 (!) 59 (!) 57  Resp: 16   18  Temp: 97.8 F (36.6 C)  97.9 F (36.6 C) 98 F (36.7 C)  TempSrc: Axillary   Oral  SpO2: 100%      General: alert Lochia: appropriate Uterine Fundus: firm Incision: Healing well with no significant drainage DVT Evaluation: No evidence of DVT seen on physical exam. Labs: Lab Results  Component Value Date   WBC 9.6  05/09/2017   HGB 8.7 (L) 05/09/2017   HCT 24.5 (L) 05/09/2017   MCV 91.8 05/09/2017   PLT 108 (L) 05/09/2017   CMP Latest Ref Rng & Units 01/14/2017  Glucose 65 - 99 mg/dL 88  BUN 6 - 20 mg/dL 9  Creatinine 4.13 - 2.44 mg/dL 0.10  Sodium 272 - 536 mmol/L 134(L)  Potassium 3.5 - 5.1 mmol/L 3.4(L)  Chloride 101 - 111 mmol/L 105  CO2 22 - 32 mmol/L 22  Calcium 8.9 - 10.3 mg/dL 6.4(Q)  Total Protein 6.5 - 8.1 g/dL 0.3(K)  Total Bilirubin 0.3 - 1.2 mg/dL 0.6  Alkaline Phos 38 - 126 U/L 40  AST 15 - 41 U/L 20  ALT 14 - 54 U/L 13(L)    Discharge instruction: per After Visit Summary and "Baby and Me Booklet".  After Visit Meds:   Diet: routine diet  Activity: Advance as tolerated. Pelvic rest for 6 weeks.   Outpatient follow up:6 weeks Follow up Appt:No future appointments. Follow up visit: No Follow-up on file.  Postpartum contraception: b/l saplingectomy  Newborn Data: Live born female  Birth Weight: 8 lb 4.6 oz (3760 g) APGAR: 6, 7  Newborn Delivery   Birth date/time:  05/08/2017 14:12:00 Delivery type:  C-Section, Low Transverse  C-section categorization:  Repeat     Baby Feeding:breast Disposition:home with mother   05/10/2017 Lendon Colonel., MD

## 2017-05-10 NOTE — Progress Notes (Signed)
POSTOPERATIVE DAY # 1 S/P CS  Seen by Dr Ernestina Penna on rounds this am See other note    Marlinda Mike CNM, MSN, [facility] 05/10/2017, 9:47 AM

## 2017-05-10 NOTE — Lactation Note (Signed)
This note was copied from a baby's chart. Lactation Consultation Note  Patient Name: Lisa Kim [id number] Date: 05/10/2017 Reason for consult: Follow-up assessment   With this mom of a term baby, now 53 hours old. The baby is at 8.9% weight loss, with more than adequate output. Mom has easily hand expressed colostrum from multiple nipple pores. Mom had an abundant supply with last child. He breast augmentation was behind the muscle, and prior to her first child.  I noted MGF holding the baby and holding a pacifier in the baby's mouth. I reviewed with mom the reasons for not using a pacifier, especially with a baby with increased weight loss. STS, and frequent nursing encouraged. The feeding diary was incomplete, so I asked that this be filled out so an accurate intake and output was available to assess Lisa Kim's nutrition status. Mom very receptive to teaching. I thought about supplement with EBM, but feel that at 43 hours post partum, breast feeding would be fine at this time, and hopefully mom's milk is soon to transition in.    Maternal Data    Feeding Feeding Type: Breast Fed Length of feed: 20 min  LATCH Score                   Interventions    Lactation Tools Discussed/Used     Consult Status Consult Status: Follow-up Date: 05/11/17 Follow-up type: In-patient    Alfred Levins 05/10/2017, 9:19 AM

## 2017-05-11 LAB — TYPE AND SCREEN
ABO/RH(D): A NEG
ANTIBODY SCREEN: POSITIVE
DAT, IgG: NEGATIVE
Unit division: 0
Unit division: 0

## 2017-05-11 LAB — BPAM RBC
BLOOD PRODUCT EXPIRATION DATE: 201810172359
Blood Product Expiration Date: 201810172359
UNIT TYPE AND RH: 9500
Unit Type and Rh: 9500

## 2017-05-11 NOTE — Progress Notes (Signed)
Pt discharged yesterday, decided to stay as baby not able to be discharged due to feeding. Baby now with stable weight, mom with good milk production. No symptoms of anemia. Minimal lochia. Pain controlled with meds. +Flatus, tole po.  PE: Vitals:   05/09/17 1755 05/10/17 0538 05/10/17 1804 05/11/17 0536  BP: (!) 91/56 (!) 95/58 (!) 97/54 (!) 99/52  Pulse: (!) 59 (!) 57 78 62  Resp:  Temp: 97.9 F (36.6 C) 98 F (36.7 C) 98.5 F (36.9 C) 98 F (36.7 C)  TempSrc:  Oral Oral Oral  SpO2:   100%    Gen: well appearing, no distress Abd: soft, approp tender, fundus firm, at umbilicus CV: RRR Pulm: CTAB LE: NT, no edema  CBC    Component Value Date/Time   WBC 9.6 05/09/2017 0622   RBC 2.67 (L) 05/09/2017 0622   HGB 8.7 (L) 05/09/2017 0622   HGB 12.3 11/08/2016 1628   HCT 24.5 (L) 05/09/2017 0622   HCT 35.9 11/08/2016 1628   PLT 108 (L) 05/09/2017 0622   PLT 176 11/08/2016 1628   MCV 91.8 05/09/2017 0622   MCV 94 11/08/2016 1628   MCH 32.6 05/09/2017 0622   MCHC 35.5 05/09/2017 0622   RDW 13.9 05/09/2017 0622   RDW 14.7 11/08/2016 1628   LYMPHSABS 1.9 01/14/2017 1952   LYMPHSABS 2.0 11/08/2016 1628   MONOABS 0.9 01/14/2017 1952   EOSABS 0.1 01/14/2017 1952   EOSABS 0.1 11/08/2016 1628   BASOSABS 0.0 01/14/2017 1952   BASOSABS 0.0 11/08/2016 1628     A/P: POD# 3, doing well. D/c home Anemia, no symptoms, no bleeding, continue home iron  Lisa Kim A. 05/11/2017 9:05 AM

## 2017-05-11 NOTE — Lactation Note (Signed)
This note was copied from a baby's chart. Lactation Consultation Note  Patient Name: Lisa Kim [id number] Date: 05/11/2017 Reason for consult: Follow-up assessment  With this mom and term baby, now 67 hours old and maintained  9% weight loss in last 24 hours. Mom reports her milk is transitioning in, and BF going well. Mom denies any questions/concerns at this time.    Maternal Data    Feeding    LATCH Score                   Interventions    Lactation Tools Discussed/Used     Consult Status Consult Status: Complete Follow-up type: Call as needed    Alfred Levins 05/11/2017, 10:53 AM

## 2017-07-22 IMAGING — US US OB LIMITED
1 series · 14 of 18 positions shown · non-contrast
Comparison: none

CLINICAL DATA: Abdominal pain

EXAM:
LIMITED OBSTETRIC ULTRASOUND

[Series 1: us ob limited · 0.23mm/px · 18 acquisitions, 14 frames shown]
[im 1/18]
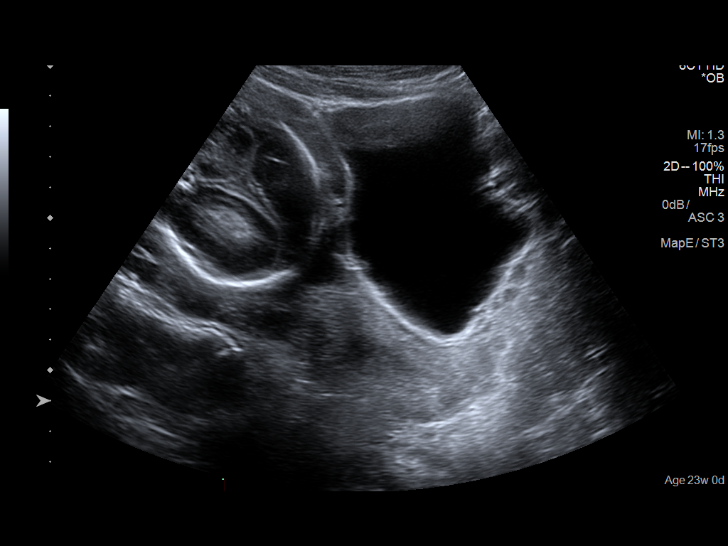
[im 2/18]
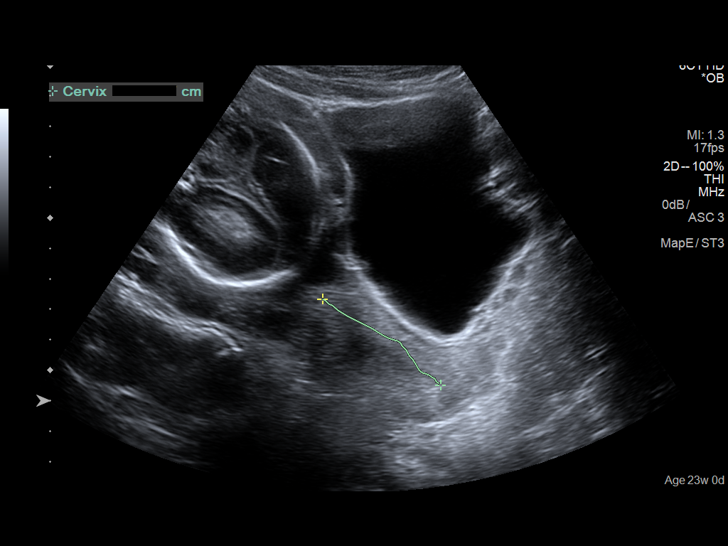
[im 4/18]
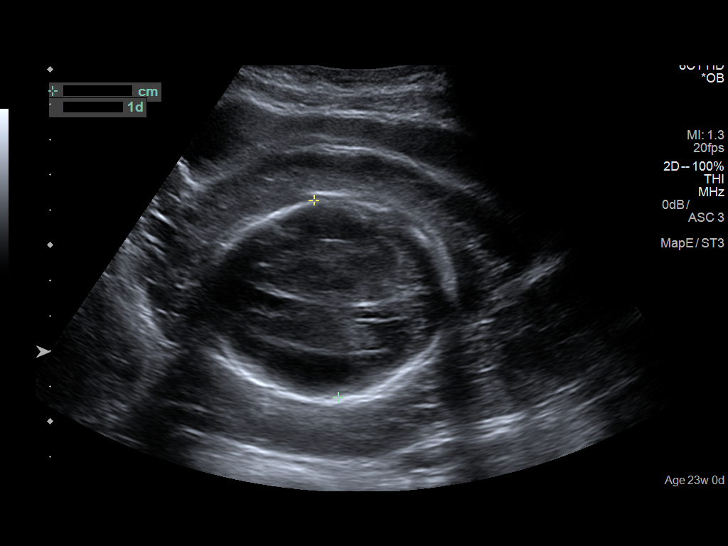
[im 5/18]
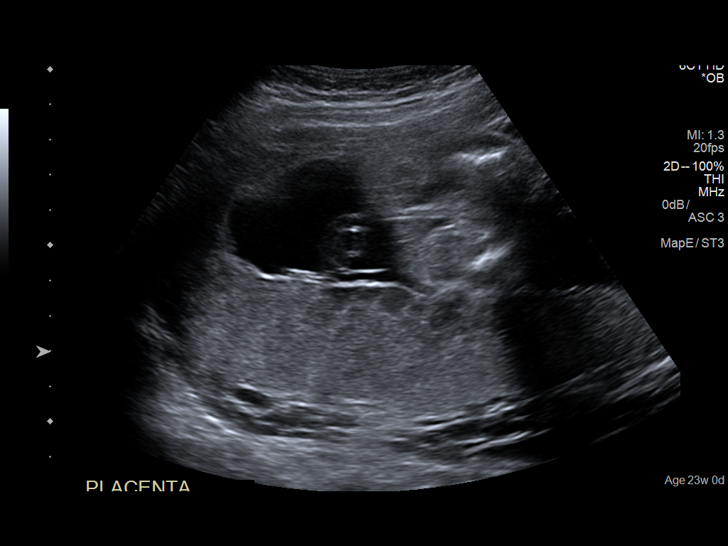
[im 6/18]
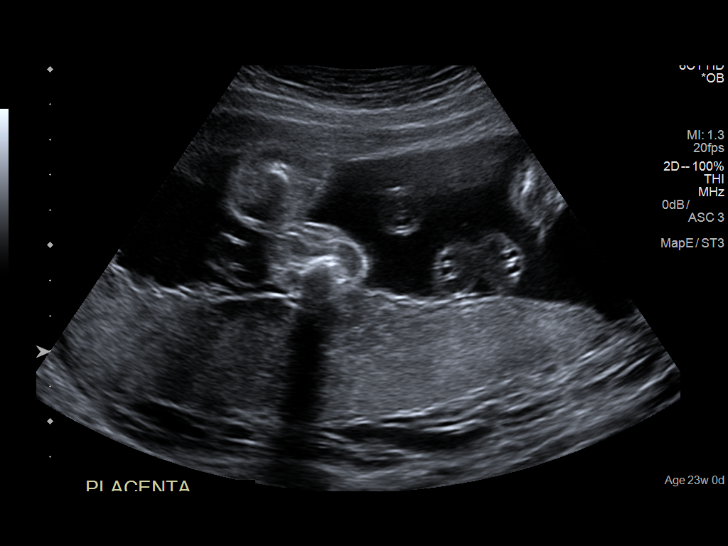
[im 8/18]
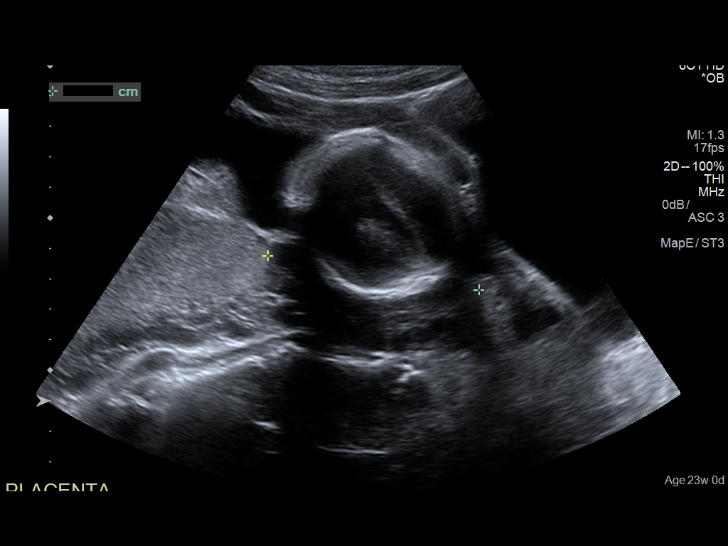
[im 9/18]
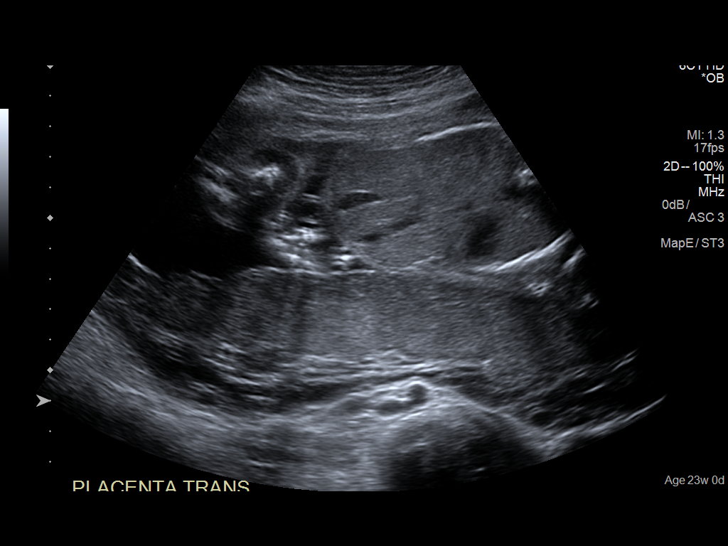
[im 10/18]
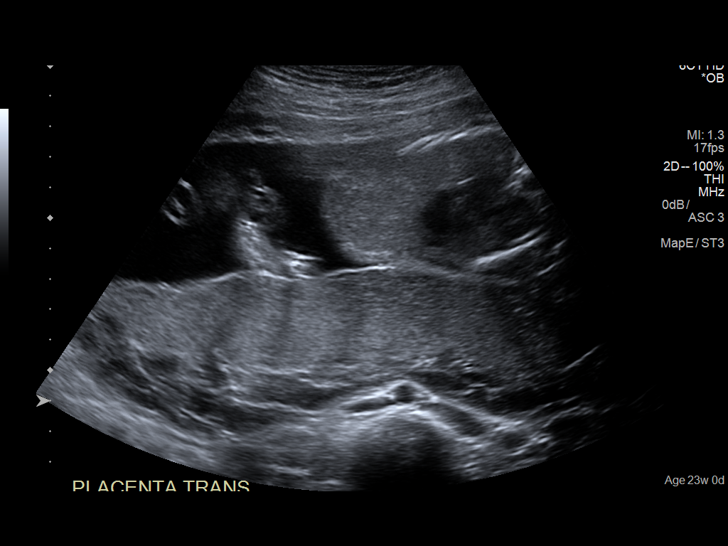
[im 11/18]
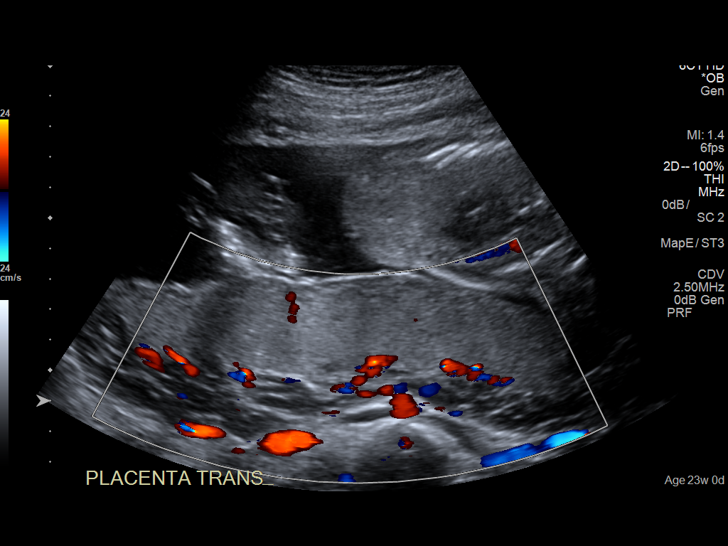
[im 13/18]
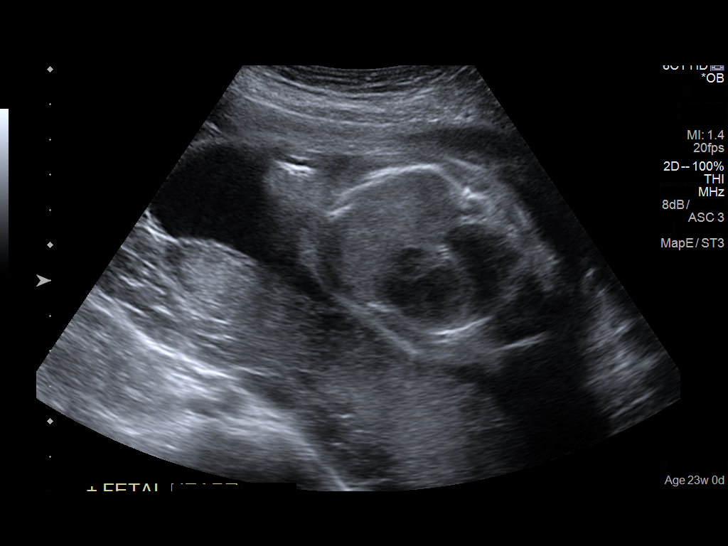
[im 14/18]
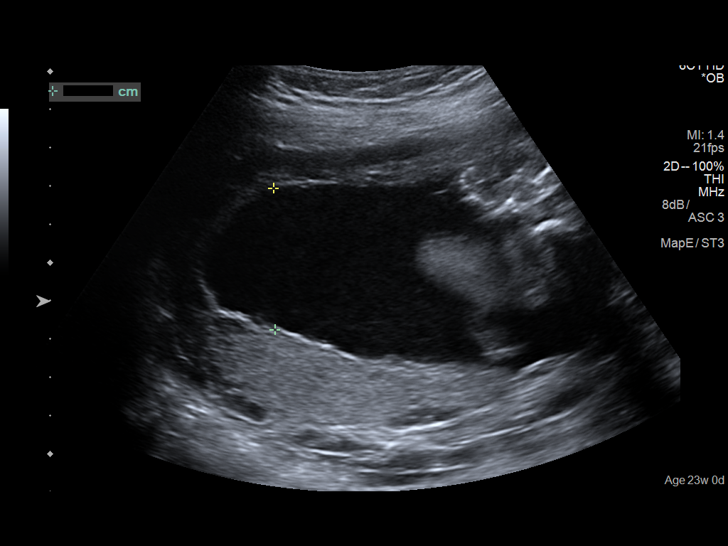
[im 15/18]
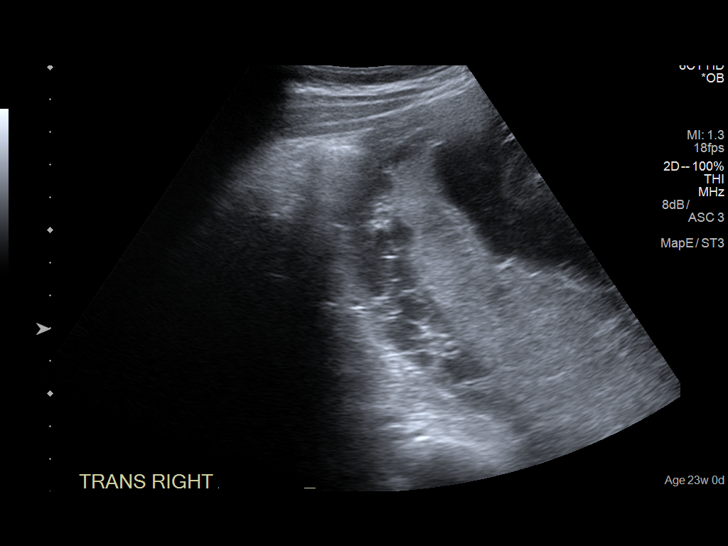
[im 17/18]
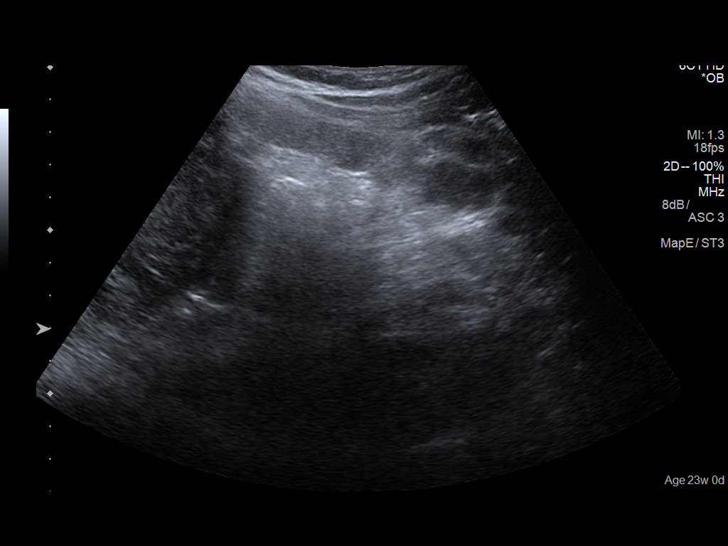
[im 18/18]
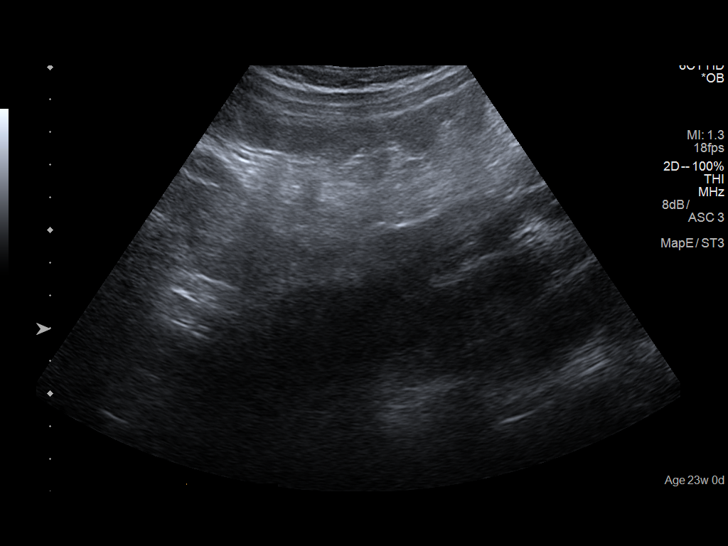

[14 of 18 positions shown; findings below may reference images not displayed]

FINDINGS: Number of Fetuses: 1

Heart Rate:  132 bpm

Movement: Visualized

Presentation: Cephalic

Placental Location: Posterior

Previa: Not visualized

Amniotic Fluid (Subjective):  Within normal limits.

BPD:  5.63cm 23w  1d

MATERNAL FINDINGS:

Cervix:  Appears closed.

Uterus/Adnexae: History of right oophorectomy. Left ovary is
nonvisualized.
IMPRESSION: Single viable intrauterine pregnancy as above. No specific
abnormalities are visualized.

This exam is performed on an emergent basis and does not
comprehensively evaluate fetal size, dating, or anatomy; follow-up
complete OB US should be considered if further fetal assessment is
warranted.

## 2017-07-25 ENCOUNTER — Encounter (HOSPITAL_COMMUNITY): Payer: Self-pay | Admitting: Obstetrics
